# Patient Record
Sex: Female | Born: 1985 | Race: Black or African American | Hispanic: No | Smoking: Current every day smoker
Health system: Southern US, Community
[De-identification: ages and names within clinical notes are randomized; demographics above are authoritative.]

## PROBLEM LIST (undated history)

## (undated) ENCOUNTER — Inpatient Hospital Stay (HOSPITAL_COMMUNITY): Payer: Self-pay

## (undated) DIAGNOSIS — Z8739 Personal history of other diseases of the musculoskeletal system and connective tissue: Secondary | ICD-10-CM

## (undated) DIAGNOSIS — F419 Anxiety disorder, unspecified: Secondary | ICD-10-CM

## (undated) DIAGNOSIS — N390 Urinary tract infection, site not specified: Secondary | ICD-10-CM

## (undated) DIAGNOSIS — R87629 Unspecified abnormal cytological findings in specimens from vagina: Secondary | ICD-10-CM

## (undated) DIAGNOSIS — Z789 Other specified health status: Secondary | ICD-10-CM

## (undated) HISTORY — DX: Anxiety disorder, unspecified: F41.9

## (undated) HISTORY — DX: Personal history of other diseases of the musculoskeletal system and connective tissue: Z87.39

## (undated) HISTORY — PX: COLPOSCOPY: SHX161

---
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
## 2006-04-19 ENCOUNTER — Inpatient Hospital Stay (HOSPITAL_COMMUNITY): Admission: AD | Admit: 2006-04-19 | Discharge: 2006-04-19 | Payer: Self-pay | Admitting: Obstetrics and Gynecology

## 2006-06-17 ENCOUNTER — Other Ambulatory Visit: Admission: RE | Admit: 2006-06-17 | Discharge: 2006-06-17 | Payer: Self-pay | Admitting: Obstetrics and Gynecology

## 2006-08-01 ENCOUNTER — Inpatient Hospital Stay (HOSPITAL_COMMUNITY): Admission: AD | Admit: 2006-08-01 | Discharge: 2006-08-01 | Payer: Self-pay | Admitting: Otolaryngology

## 2006-10-05 ENCOUNTER — Inpatient Hospital Stay (HOSPITAL_COMMUNITY): Admission: AD | Admit: 2006-10-05 | Discharge: 2006-10-08 | Payer: Self-pay | Admitting: Obstetrics and Gynecology

## 2009-01-07 ENCOUNTER — Emergency Department (HOSPITAL_COMMUNITY): Admission: EM | Admit: 2009-01-07 | Discharge: 2009-01-08 | Payer: Self-pay | Admitting: Emergency Medicine

## 2009-09-06 ENCOUNTER — Inpatient Hospital Stay (HOSPITAL_COMMUNITY): Admission: AD | Admit: 2009-09-06 | Discharge: 2009-09-06 | Payer: Self-pay | Admitting: Obstetrics and Gynecology

## 2009-09-24 ENCOUNTER — Inpatient Hospital Stay (HOSPITAL_COMMUNITY): Admission: AD | Admit: 2009-09-24 | Discharge: 2009-09-24 | Payer: Self-pay | Admitting: Obstetrics and Gynecology

## 2009-09-25 ENCOUNTER — Inpatient Hospital Stay (HOSPITAL_COMMUNITY): Admission: AD | Admit: 2009-09-25 | Discharge: 2009-09-25 | Payer: Self-pay | Admitting: Obstetrics and Gynecology

## 2009-09-25 ENCOUNTER — Inpatient Hospital Stay (HOSPITAL_COMMUNITY): Admission: AD | Admit: 2009-09-25 | Discharge: 2009-09-27 | Payer: Self-pay | Admitting: Obstetrics and Gynecology

## 2010-02-01 IMAGING — CR DG ABDOMEN ACUTE W/ 1V CHEST
3 series · 3 of 3 positions shown · non-contrast
Comparison: None

CLINICAL DATA: Abdominal pain.

ACUTE ABDOMEN SERIES (ABDOMEN 2 VIEW & CHEST 1 VIEW)

[w chest pa]
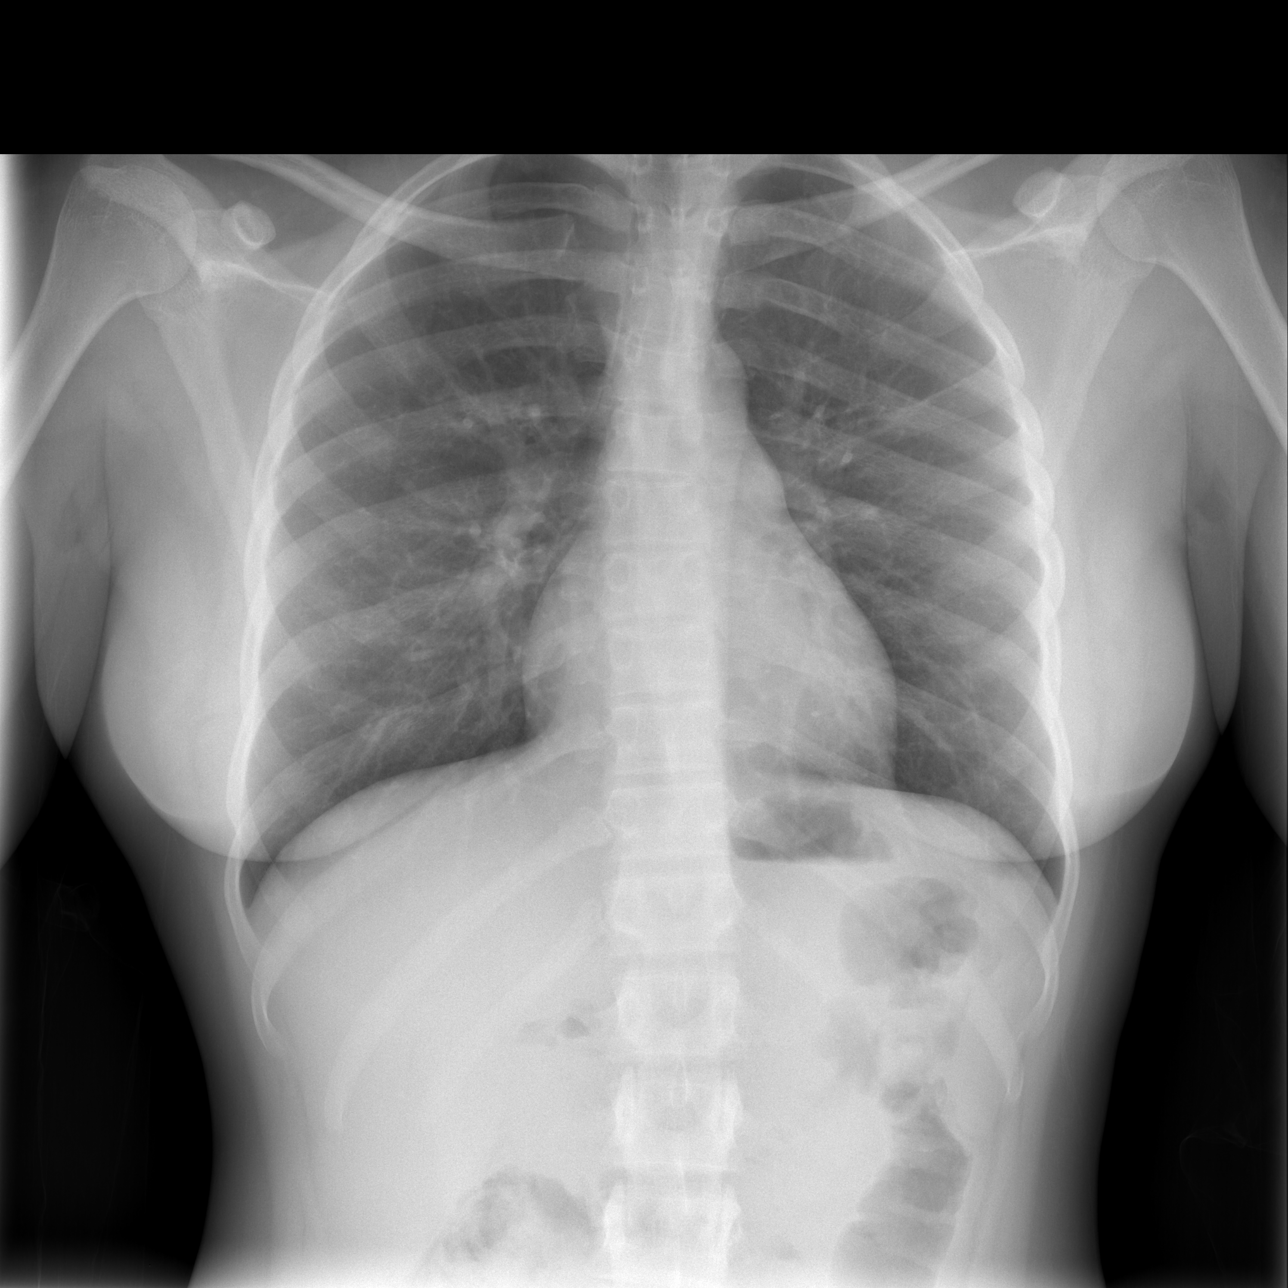

[w abdomen upright *]
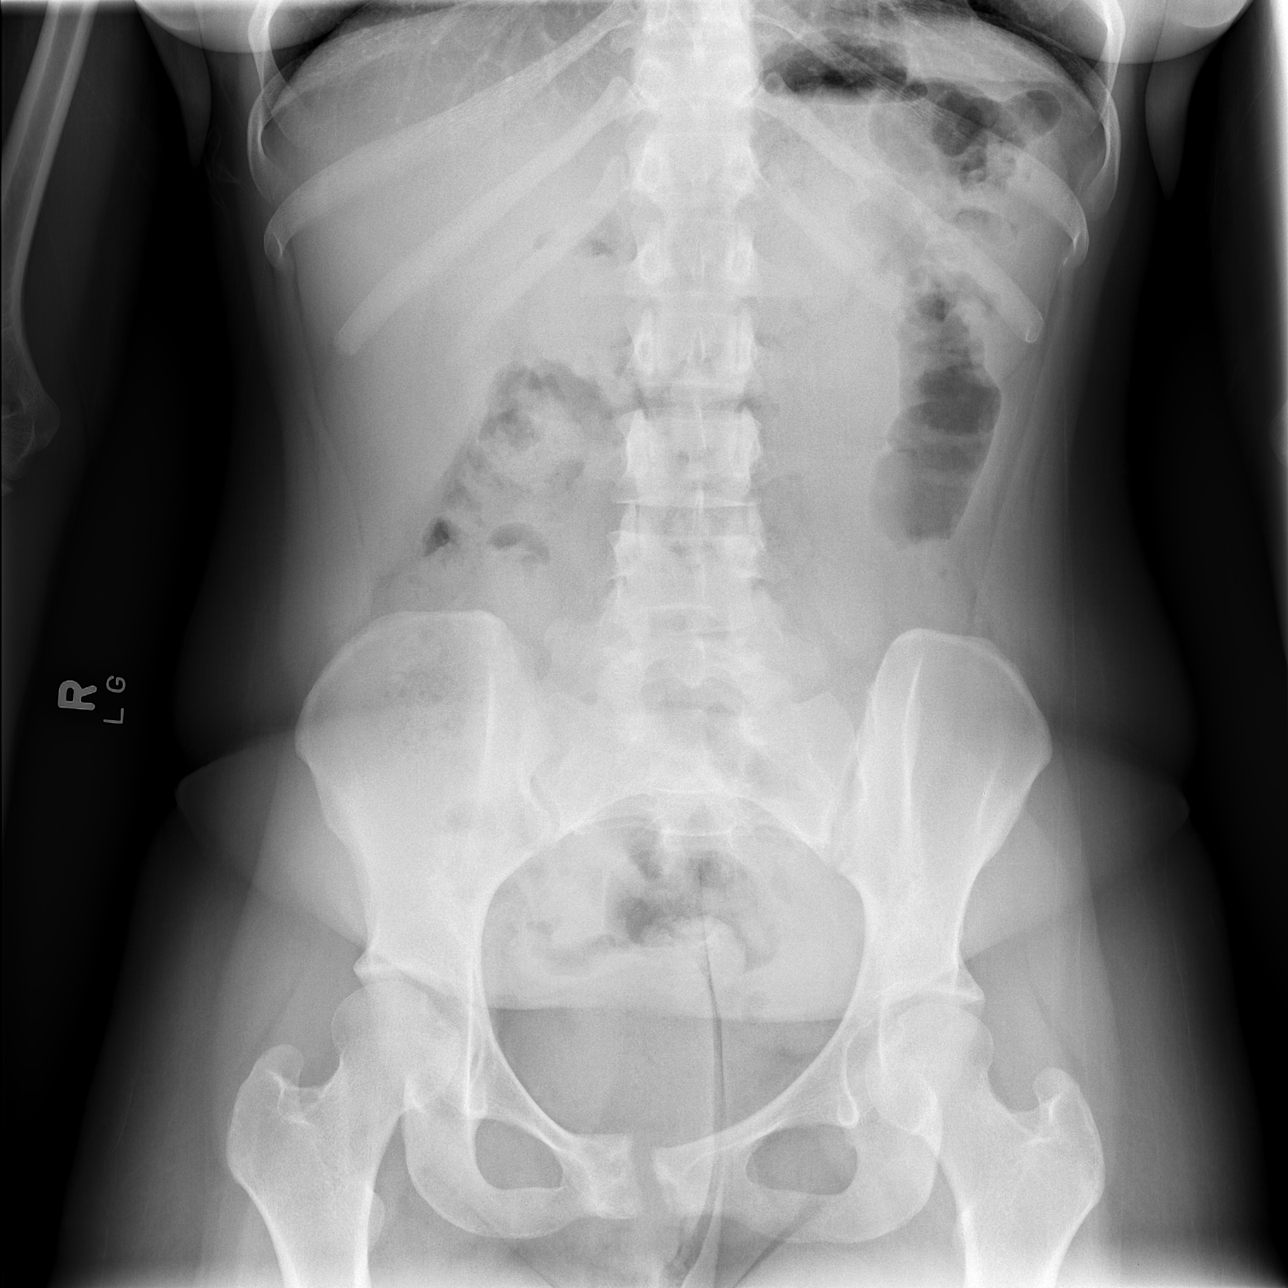

[t abdomen supine]
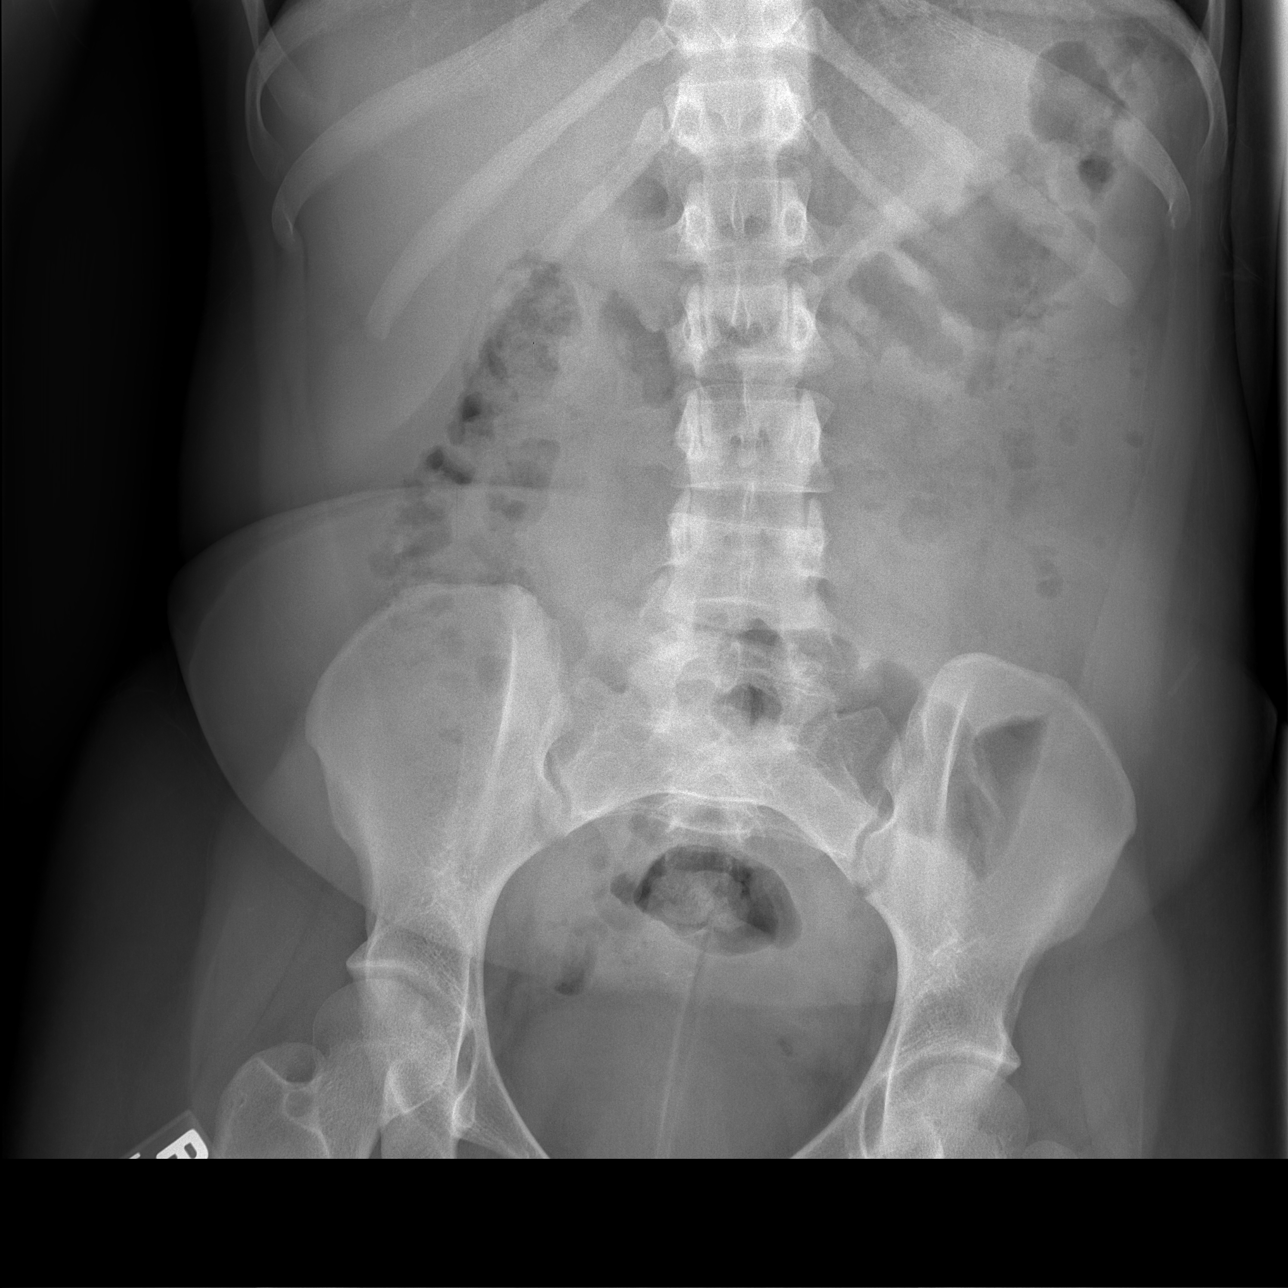

[3 of 3 positions shown; findings below may reference images not displayed]

FINDINGS: The lungs are clear and there is no infiltrate or
effusion.

Nonobstructive bowel gas pattern.  There is no free air.  There is
no renal calculi and there is no bony abnormality.
IMPRESSION: Negative

## 2010-12-24 ENCOUNTER — Emergency Department (HOSPITAL_COMMUNITY)
Admission: EM | Admit: 2010-12-24 | Discharge: 2010-12-24 | Payer: Self-pay | Source: Home / Self Care | Admitting: Emergency Medicine

## 2010-12-28 LAB — URINALYSIS, ROUTINE W REFLEX MICROSCOPIC
Bilirubin Urine: NEGATIVE
Hgb urine dipstick: NEGATIVE
Ketones, ur: NEGATIVE mg/dL
Nitrite: NEGATIVE
Protein, ur: NEGATIVE mg/dL
Specific Gravity, Urine: 1.025 (ref 1.005–1.030)
Urine Glucose, Fasting: NEGATIVE mg/dL
Urobilinogen, UA: 0.2 mg/dL (ref 0.0–1.0)
pH: 6.5 (ref 5.0–8.0)

## 2010-12-28 LAB — POCT PREGNANCY, URINE: Preg Test, Ur: NEGATIVE

## 2011-03-18 LAB — CBC
HCT: 31.4 % — ABNORMAL LOW (ref 36.0–46.0)
HCT: 31.6 % — ABNORMAL LOW (ref 36.0–46.0)
Hemoglobin: 10.3 g/dL — ABNORMAL LOW (ref 12.0–15.0)
Hemoglobin: 10.3 g/dL — ABNORMAL LOW (ref 12.0–15.0)
MCHC: 32.5 g/dL (ref 30.0–36.0)
MCHC: 32.7 g/dL (ref 30.0–36.0)
MCV: 86.1 fL (ref 78.0–100.0)
MCV: 87.2 fL (ref 78.0–100.0)
Platelets: 116 10*3/uL — ABNORMAL LOW (ref 150–400)
Platelets: 92 10*3/uL — ABNORMAL LOW (ref 150–400)
RBC: 3.6 MIL/uL — ABNORMAL LOW (ref 3.87–5.11)
RBC: 3.67 MIL/uL — ABNORMAL LOW (ref 3.87–5.11)
RDW: 14.8 % (ref 11.5–15.5)
RDW: 14.8 % (ref 11.5–15.5)
WBC: 10.3 10*3/uL (ref 4.0–10.5)
WBC: 11.8 10*3/uL — ABNORMAL HIGH (ref 4.0–10.5)

## 2011-03-18 LAB — RH IMMUNE GLOB WKUP(>/=20WKS)(NOT WOMEN'S HOSP)
Antibody Screen: NEGATIVE
Fetal Screen: NEGATIVE

## 2011-03-18 LAB — RPR: RPR Ser Ql: NONREACTIVE

## 2011-03-29 LAB — DIFFERENTIAL
Basophils Absolute: 0.1 10*3/uL (ref 0.0–0.1)
Basophils Relative: 1 % (ref 0–1)
Eosinophils Absolute: 0.3 10*3/uL (ref 0.0–0.7)
Eosinophils Relative: 5 % (ref 0–5)
Lymphocytes Relative: 38 % (ref 12–46)
Lymphs Abs: 2.3 10*3/uL (ref 0.7–4.0)
Monocytes Absolute: 0.5 10*3/uL (ref 0.1–1.0)
Monocytes Relative: 9 % (ref 3–12)
Neutro Abs: 2.8 10*3/uL (ref 1.7–7.7)
Neutrophils Relative %: 47 % (ref 43–77)

## 2011-03-29 LAB — COMPREHENSIVE METABOLIC PANEL
ALT: 11 U/L (ref 0–35)
AST: 17 U/L (ref 0–37)
Albumin: 3.5 g/dL (ref 3.5–5.2)
Alkaline Phosphatase: 38 U/L — ABNORMAL LOW (ref 39–117)
BUN: 11 mg/dL (ref 6–23)
CO2: 26 mEq/L (ref 19–32)
Calcium: 9.2 mg/dL (ref 8.4–10.5)
Chloride: 107 mEq/L (ref 96–112)
Creatinine, Ser: 0.59 mg/dL (ref 0.4–1.2)
GFR calc Af Amer: >60 mL/min (ref >60)
GFR calc non Af Amer: >60 mL/min (ref >60)
Glucose, Bld: 81 mg/dL (ref 70–99)
Potassium: 4.1 mEq/L (ref 3.5–5.1)
Sodium: 137 mEq/L (ref 135–145)
Total Bilirubin: 0.4 mg/dL (ref 0.3–1.2)
Total Protein: 6.2 g/dL (ref 6.0–8.3)

## 2011-03-29 LAB — URINALYSIS, ROUTINE W REFLEX MICROSCOPIC
Bilirubin Urine: NEGATIVE
Glucose, UA: NEGATIVE mg/dL
Hgb urine dipstick: NEGATIVE
Ketones, ur: NEGATIVE mg/dL
Nitrite: NEGATIVE
Protein, ur: NEGATIVE mg/dL
Specific Gravity, Urine: 1.03 (ref 1.005–1.030)
Urobilinogen, UA: 1 mg/dL (ref 0.0–1.0)
pH: 6 (ref 5.0–8.0)

## 2011-03-29 LAB — CBC
HCT: 39.8 % (ref 36.0–46.0)
Hemoglobin: 12.9 g/dL (ref 12.0–15.0)
MCHC: 32.3 g/dL (ref 30.0–36.0)
MCV: 83.4 fL (ref 78.0–100.0)
Platelets: 176 10*3/uL (ref 150–400)
RBC: 4.78 MIL/uL (ref 3.87–5.11)
RDW: 14.5 % (ref 11.5–15.5)
WBC: 6.1 10*3/uL (ref 4.0–10.5)

## 2011-03-29 LAB — WET PREP, GENITAL
Trich, Wet Prep: NONE SEEN
WBC, Wet Prep HPF POC: NONE SEEN
Yeast Wet Prep HPF POC: NONE SEEN

## 2011-03-29 LAB — GC/CHLAMYDIA PROBE AMP, GENITAL
Chlamydia, DNA Probe: NEGATIVE
GC Probe Amp, Genital: NEGATIVE

## 2011-03-29 LAB — POCT PREGNANCY, URINE: Preg Test, Ur: NEGATIVE

## 2011-03-29 LAB — LIPASE, BLOOD: Lipase: 32 U/L (ref 11–59)

## 2011-04-30 NOTE — Discharge Summary (Signed)
NAME:  April Schaefer, April Schaefer            ACCOUNT NO.:  [id number]   MEDICAL RECORD NO.:  [id number]          PATIENT TYPE:  INP   LOCATION:  9103                          FACILITY:  [facility]   PHYSICIAN:  Sherron Monday, MD        DATE OF BIRTH:  1986/06/23   DATE OF ADMISSION:  10/05/2006  DATE OF DISCHARGE:  10/08/2006                                 DISCHARGE SUMMARY   ADMITTING DIAGNOSIS:  Intrauterine pregnancy at term, laboring.   DISCHARGE DIAGNOSES:  1. Intrauterine pregnancy at term, laboring.  2. Delivered via term spontaneous vaginal delivery.   HISTORY OF PRESENT ILLNESS:  A 25 year old G3, P1-0-1-1 at 37-5/7 weeks with  contractions since 3 p.m. that day.  She was at approximately 6, no loss of  fluid, no vaginal bleeding, good fetal movement.   PAST MEDICAL HISTORY:  Noncontributory.   PAST SURGICAL HISTORY:  None.   SIGNIFICANT OB/GYN HISTORY:  G1 was a miscarriage.  G2 term vaginal delivery  of a female infant who is 68 months old currently.  G3 is the present.  She  has no history of any abnormal Pap smears or sexually transmitted disease.   MEDICATIONS:  None.   ALLERGIES:  No known drug allergies.   SOCIAL HISTORY:  Positive for tobacco, roughly 2-3 cigarettes a day.  No  alcohol or drugs.  She is single.   FAMILY HISTORY:  Significant for diabetes in her mother, otherwise negative.   PRENATAL LABS:  She is group B Strep positive.  Glucola 133, RPR  nonreactive.  O negative.  Hemoglobin 11.2, platelets 162,000.  Antibody  screen negative, sickle cell within normal limits. Gonorrhea negative,  Chlamydia negative, rubella immune.  Hepatitis C surface antigen negative.  HIV negative.   On admission she is 6 cm dilated, 90% effaced and 0 station.  Otherwise her  exam was within normal limits.  Fetal heart tones were in the 140s and  reactive with contractions every 1-3 minutes, somewhat irregular, She was  admitted to the hospital and given penicillin for group B strep  prophylaxis.  Her pregnancy is dated by a first trimester ultrasound.  Her intrapartum  course was relatively uncomplicated.  She had artificial rupture of  membranes at 7 o'clock of clear fluid.  She progressed to Complete/ Complete  and + 2 and pushed for delivery of a viable female infant at 29 with Apgars  of 9 and 9 at one and five minutes respectively; and a weight of 5 pounds 13  ounces.  A second degree perineal laceration was repaired with 3-0 Vicryl.  Her postpartum course was relatively uncomplicated.  Her hemoglobin had  decreased from 10.6 to 9.2; however, she tolerated that well.  She is  afebrile with vital signs stable.  She will breast feed the baby.  She plans  on using the Nuvaring for contraception.  She is rubella immune and O  negative.  She will be discharged home with prescription for prenatal  vitamins as well as  ibuprofen.  She desires Nuvaring for contraception and will start that her  postpartum checkup.  She was  given appropriate numbers to call if there are  any questions or problems.  She voiced her understanding to all this and was  discharged to home.      Sherron Monday, MD  Electronically Signed     JB/MEDQ  D:  10/07/2006  T:  10/08/2006  Job:  [id number]

## 2011-10-10 ENCOUNTER — Emergency Department (HOSPITAL_COMMUNITY)
Admission: EM | Admit: 2011-10-10 | Discharge: 2011-10-10 | Disposition: A | Discharge status: Home / Self Care | Payer: Self-pay | Attending: Emergency Medicine | Admitting: Emergency Medicine

## 2011-10-10 DIAGNOSIS — R079 Chest pain, unspecified: Secondary | ICD-10-CM | POA: Insufficient documentation

## 2011-10-10 DIAGNOSIS — F410 Panic disorder [episodic paroxysmal anxiety] without agoraphobia: Secondary | ICD-10-CM | POA: Insufficient documentation

## 2011-10-10 DIAGNOSIS — M940 Chondrocostal junction syndrome [Tietze]: Secondary | ICD-10-CM | POA: Insufficient documentation

## 2011-12-19 ENCOUNTER — Emergency Department (HOSPITAL_COMMUNITY): Payer: Self-pay

## 2011-12-19 ENCOUNTER — Emergency Department (HOSPITAL_COMMUNITY)
Admission: EM | Admit: 2011-12-19 | Discharge: 2011-12-20 | Disposition: A | Discharge status: Home / Self Care | Payer: Self-pay | Attending: Emergency Medicine | Admitting: Emergency Medicine

## 2011-12-19 DIAGNOSIS — R059 Cough, unspecified: Secondary | ICD-10-CM | POA: Insufficient documentation

## 2011-12-19 DIAGNOSIS — R079 Chest pain, unspecified: Secondary | ICD-10-CM | POA: Insufficient documentation

## 2011-12-19 DIAGNOSIS — R0602 Shortness of breath: Secondary | ICD-10-CM | POA: Insufficient documentation

## 2011-12-19 DIAGNOSIS — F17200 Nicotine dependence, unspecified, uncomplicated: Secondary | ICD-10-CM | POA: Insufficient documentation

## 2011-12-19 DIAGNOSIS — R05 Cough: Secondary | ICD-10-CM | POA: Insufficient documentation

## 2011-12-19 DIAGNOSIS — R5381 Other malaise: Secondary | ICD-10-CM | POA: Insufficient documentation

## 2011-12-19 DIAGNOSIS — J3489 Other specified disorders of nose and nasal sinuses: Secondary | ICD-10-CM | POA: Insufficient documentation

## 2011-12-19 DIAGNOSIS — J069 Acute upper respiratory infection, unspecified: Secondary | ICD-10-CM | POA: Insufficient documentation

## 2011-12-19 DIAGNOSIS — R6883 Chills (without fever): Secondary | ICD-10-CM | POA: Insufficient documentation

## 2011-12-19 DIAGNOSIS — H9209 Otalgia, unspecified ear: Secondary | ICD-10-CM | POA: Insufficient documentation

## 2011-12-19 DIAGNOSIS — R070 Pain in throat: Secondary | ICD-10-CM | POA: Insufficient documentation

## 2011-12-19 MED ORDER — CETIRIZINE-PSEUDOEPHEDRINE ER 5-120 MG PO TB12: 1.0000 | ORAL_TABLET | Freq: Every day | ORAL | Status: AC

## 2011-12-19 MED ORDER — HYDROCODONE-HOMATROPINE 5-1.5 MG/5ML PO SYRP: 5.0000 mL | ORAL_SOLUTION | Freq: Four times a day (QID) | ORAL | Status: AC | PRN

## 2011-12-19 MED ORDER — IBUPROFEN 800 MG PO TABS
800.0000 mg | ORAL_TABLET | Freq: Once | ORAL | Status: AC
  Administered 2011-12-19: 800 mg via ORAL
  Filled 2011-12-19: qty 1

## 2011-12-19 MED ORDER — HYDROCOD POLST-CHLORPHEN POLST 10-8 MG/5ML PO LQCR
5.0000 mL | Freq: Once | ORAL | Status: AC
  Administered 2011-12-19: 5 mL via ORAL
  Filled 2011-12-19: qty 5

## 2011-12-19 NOTE — ED Notes (Signed)
Pt reports URI and sore throat 2 days ago- Increased coughing at night.  Upper chest pain increased pain with coughinh

## 2011-12-19 NOTE — ED Provider Notes (Signed)
History     CSN: [id number]  Arrival date & time 12/19/11  2003   First MD Initiated Contact with Patient 12/19/11 2214      Chief Complaint  Patient presents with  . URI  . Sore Throat  . Chest Pain    (Consider location/radiation/quality/duration/timing/severity/associated sxs/prior treatment) Patient is a 26 y.o. female presenting with URI. The history is provided by the patient.  URI The primary symptoms include fatigue, ear pain, sore throat and cough. Primary symptoms do not include fever. The current episode started 2 days ago. This is a new problem. The problem has not changed since onset. Symptoms associated with the illness include chills, plugged ear sensation, sinus pressure and congestion.  Pt states URI symptoms for two days. Today coughing, chest pain with cough, radiating to the back. Denies fever, chills, nausea, vomiting, abdominal pain, malaise. NO neck pain or stiffness. Took, nyquil with no relief.  History reviewed. No pertinent past medical history.  History reviewed. No pertinent past surgical history.  History reviewed. No pertinent family history.  History  Substance Use Topics  . Smoking status: Current Everyday Smoker  . Smokeless tobacco: Not on file  . Alcohol Use: Yes    OB History    Grav Para Term Preterm Abortions TAB SAB Ect Mult Living                  Review of Systems  Constitutional: Positive for chills and fatigue. Negative for fever.  HENT: Positive for ear pain, congestion, sore throat and sinus pressure.   Eyes: Negative.   Respiratory: Positive for cough and shortness of breath.   Cardiovascular: Positive for chest pain. Negative for leg swelling.  Gastrointestinal: Negative.   Genitourinary: Negative.   Musculoskeletal: Negative.   Neurological: Negative.   Psychiatric/Behavioral: Negative.     Allergies  Nickel  Home Medications  No current outpatient prescriptions on file.  BP 158/78  Pulse 77  Temp(Src) 98.8  F (37.1 C) (Oral)  Resp 18  Wt 150 lb (68.04 kg)  SpO2 100%  LMP 11/18/2011  Physical Exam  Nursing note and vitals reviewed. Constitutional: She is oriented to person, place, and time. She appears well-developed and well-nourished. No distress.  HENT:  Head: Normocephalic and atraumatic.  Right Ear: Tympanic membrane, external ear and ear canal normal.  Left Ear: Tympanic membrane, external ear and ear canal normal.  Nose: Rhinorrhea present.  Mouth/Throat: Uvula is midline, oropharynx is clear and moist and mucous membranes are normal.  Eyes: Conjunctivae are normal.  Neck: Normal range of motion. Neck supple.  Cardiovascular: Normal rate, regular rhythm and normal heart sounds.   Pulmonary/Chest: Effort normal and breath sounds normal. No respiratory distress. She has no wheezes.  Abdominal: Soft. Bowel sounds are normal. There is no tenderness.  Musculoskeletal: Normal range of motion.  Lymphadenopathy:    She has no cervical adenopathy.  Neurological: She is alert and oriented to person, place, and time.  Skin: Skin is warm and dry. No rash noted.  Psychiatric: She has a normal mood and affect.    ED Course  Procedures (including critical care time)  Labs Reviewed - No data to display Dg Chest 2 View  12/19/2011  *RADIOLOGY REPORT*  Clinical Data: Chest pain.  Cough.  CHEST - 2 VIEW  Comparison: 12/24/2010.  Findings:  Cardiopericardial silhouette within normal limits. Mediastinal contours normal. Trachea midline.  No airspace disease or effusion.  IMPRESSION: Negative two-view chest.  Original Report Authenticated By: Andreas Newport,  M.D.   URI symptoms. Normal VS. Negative chest. Suspect a viral URI. Will d/c home with follow up.  No diagnosis found.    MDM          Lottie Mussel, PA 12/19/11 [phone]

## 2011-12-19 NOTE — ED Notes (Signed)
Pt reports body aches chest, abdomen and back. Pt reports her neck feels swollen.  Pt reports two days ago symptoms started with sore throat. Pt denies N/V/D.  Pt reports felt warm to touch, but unsure of fever. Pt reports no first degree friend or relative sick like this. Pt denies taking medication for her pain.  Pt reports taking nyquil on first night of symptoms without relief. Pt also reports hoarseness. Cough followed sore throat. Non-productive, dry.

## 2011-12-20 NOTE — ED Provider Notes (Signed)
Medical screening examination/treatment/procedure(s) were performed by non-physician practitioner and as supervising physician I was immediately available for consultation/collaboration.  Raeford Razor, MD 12/20/11 [phone]

## 2013-02-23 ENCOUNTER — Encounter (HOSPITAL_COMMUNITY): Payer: Self-pay | Admitting: Emergency Medicine

## 2013-02-23 ENCOUNTER — Emergency Department (HOSPITAL_COMMUNITY)
Admission: EM | Admit: 2013-02-23 | Discharge: 2013-02-23 | Disposition: A | Discharge status: Home / Self Care | Payer: Self-pay | Attending: Emergency Medicine | Admitting: Emergency Medicine

## 2013-02-23 DIAGNOSIS — T4995XA Adverse effect of unspecified topical agent, initial encounter: Secondary | ICD-10-CM | POA: Insufficient documentation

## 2013-02-23 DIAGNOSIS — L299 Pruritus, unspecified: Secondary | ICD-10-CM | POA: Insufficient documentation

## 2013-02-23 DIAGNOSIS — F17200 Nicotine dependence, unspecified, uncomplicated: Secondary | ICD-10-CM | POA: Insufficient documentation

## 2013-02-23 DIAGNOSIS — T7840XA Allergy, unspecified, initial encounter: Secondary | ICD-10-CM | POA: Insufficient documentation

## 2013-02-23 MED ORDER — PREDNISONE 10 MG PO TABS: 60.0000 mg | ORAL_TABLET | Freq: Every day | ORAL | Status: AC

## 2013-02-23 MED ORDER — DIPHENHYDRAMINE HCL 25 MG PO TABS: 25.0000 mg | ORAL_TABLET | Freq: Four times a day (QID) | ORAL | Status: DC

## 2013-02-23 MED ORDER — FAMOTIDINE 20 MG PO TABS
20.0000 mg | ORAL_TABLET | Freq: Once | ORAL | Status: AC
  Administered 2013-02-23: 20 mg via ORAL
  Filled 2013-02-23: qty 1

## 2013-02-23 MED ORDER — PREDNISONE 20 MG PO TABS
60.0000 mg | ORAL_TABLET | Freq: Once | ORAL | Status: AC
  Administered 2013-02-23: 60 mg via ORAL
  Filled 2013-02-23: qty 3

## 2013-02-23 MED ORDER — RANITIDINE HCL 150 MG PO CAPS: 150.0000 mg | ORAL_CAPSULE | Freq: Two times a day (BID) | ORAL | Status: DC

## 2013-02-23 NOTE — ED Notes (Signed)
Patient complaining of an allergic reaction shortly after eating dinner this evening around 1800 (ate steak, cheese, peppers, onions, and broccoli).  Took Benadryl (two capsules) around 1900; had provided little to no relief.  Patient reports that she had a similar allergic reaction three years ago, took Benadryl, and symptoms subsided.  Patient alert and oriented x4; PERRL present.  Upon arrival to room, patient changed into gown and IV started.  Airway clear at this time; breathing regular and unlabored.  Redness noted to face.  Eyes slightly swollen.  Patient complaining of itchiness in back and breast areas.  Will continue to monitor.

## 2013-02-23 NOTE — ED Notes (Signed)
Patient given copy of discharge paperwork; went over discharge instructions with patient.  Patient instructed to take prescriptions as directed, to follow up with PCP within one week, and to return to the ED for new, worsening, or concerning symptoms.

## 2013-02-23 NOTE — ED Notes (Signed)
C/o allergic reaction that started around 6:30pm.  C/o itching all over, sob, and feeling like throat is closing.  Pt states she ate hamburger steak with mushrooms and broccoli around 4:30pm.  Denies any known allergies.  Denies new soaps, laundry powder, etc.  Took 2 Benadryl at 7pm.

## 2013-02-23 NOTE — ED Provider Notes (Signed)
History     CSN: [id number]  Arrival date & time 02/23/13  1947   First MD Initiated Contact with Patient 02/23/13 2111      Chief Complaint  Patient presents with  . Allergic Reaction    (Consider location/radiation/quality/duration/timing/severity/associated sxs/prior treatment) Patient is a 27 y.o. female presenting with allergic reaction.  Allergic Reaction The primary symptoms are  rash, angioedema and urticaria. The primary symptoms do not include wheezing, shortness of breath, cough, abdominal pain, nausea, vomiting, diarrhea or dizziness. The current episode started 3 to 5 hours ago. The problem has not changed since onset.This is a new problem.  The rash appears on the chest. The rash is associated with itching.  It is located on the lips and eyelids. The angioedema is not associated with shortness of breath or stridor.   Associated with: unknown. Significant symptoms also include itching.    History reviewed. No pertinent past medical history.  History reviewed. No pertinent past surgical history.  No family history on file.  History  Substance Use Topics  . Smoking status: Current Every Day Smoker  . Smokeless tobacco: Not on file  . Alcohol Use: Yes    OB History   Grav Para Term Preterm Abortions TAB SAB Ect Mult Living                  Review of Systems  Constitutional: Negative for fever.  HENT: Negative for congestion.   Respiratory: Negative for cough, shortness of breath, wheezing and stridor.   Cardiovascular: Negative for chest pain.  Gastrointestinal: Negative for nausea, vomiting, abdominal pain and diarrhea.  Genitourinary: Negative for difficulty urinating.  Skin: Positive for itching and rash.  Neurological: Negative for dizziness.  All other systems reviewed and are negative.    Allergies  Nickel  Home Medications  No current outpatient prescriptions on file.  BP 131/84  Pulse 72  Temp(Src) 98.5 F (36.9 C) (Oral)  Resp 18   SpO2 100%  LMP 02/09/2013  Physical Exam  Nursing note and vitals reviewed. Constitutional: She is oriented to person, place, and time. She appears well-developed and well-nourished. No distress.  HENT:  Head: Normocephalic and atraumatic.  Mouth/Throat: Oropharynx is clear and moist.  Mild flushing and redness of nasolabial folds. Mild swelling of left eyelid.   No oropharyngeal edema or erythema.   No stridor.   Eyes: Conjunctivae are normal. Pupils are equal, round, and reactive to light. No scleral icterus.  Neck: Neck supple.  Cardiovascular: Normal rate, regular rhythm, normal heart sounds and intact distal pulses.   No murmur heard. Pulmonary/Chest: Effort normal and breath sounds normal. No stridor. No respiratory distress. She has no wheezes. She has no rales.  Abdominal: Soft. Bowel sounds are normal. She exhibits no distension. There is no tenderness. There is no rigidity, no rebound and no guarding.  Musculoskeletal: Normal range of motion.  Neurological: She is alert and oriented to person, place, and time.  Skin: Skin is warm and dry. No rash noted.  No urticaria  Psychiatric: She has a normal mood and affect. Her behavior is normal.    ED Course  Procedures (including critical care time)  Labs Reviewed - No data to display No results found.   1. Allergic reaction, initial encounter       MDM  27 yo female with apparent allergic reaction.  Well appearing, no distress.  Denies symptoms consistent with anaphylaxis.  Does not need epi.  She denies throat swelling to me and has  none on exam.  She does not want to take benadryl in ED because she did not want to be drowsy for her drive home.  She did improve somewhat from her prednisone and H2 antagonist (and from the benadryl she took at home.)  DC'd in good condition with good instructions and advised follow up.          Rennis Petty, MD 02/24/13 0010

## 2013-02-24 NOTE — ED Provider Notes (Signed)
I saw and evaluated the patient, reviewed the resident's note and I agree with the findings and plan.  Toy Baker, MD 02/24/13 [phone]

## 2014-03-12 LAB — OB RESULTS CONSOLE GC/CHLAMYDIA
CHLAMYDIA, DNA PROBE: NEGATIVE
Chlamydia: NEGATIVE
Gonorrhea: NEGATIVE
Gonorrhea: NEGATIVE

## 2014-03-12 LAB — OB RESULTS CONSOLE RUBELLA ANTIBODY, IGM
RUBELLA: IMMUNE
Rubella: IMMUNE

## 2014-03-12 LAB — OB RESULTS CONSOLE RPR
RPR: NONREACTIVE
RPR: NONREACTIVE

## 2014-03-12 LAB — OB RESULTS CONSOLE ABO/RH
RH TYPE: NEGATIVE
RH Type: NEGATIVE

## 2014-03-12 LAB — OB RESULTS CONSOLE HEPATITIS B SURFACE ANTIGEN
Hepatitis B Surface Ag: NEGATIVE
Hepatitis B Surface Ag: NEGATIVE

## 2014-03-12 LAB — OB RESULTS CONSOLE ANTIBODY SCREEN
Antibody Screen: NEGATIVE
Antibody Screen: NEGATIVE

## 2014-03-12 LAB — OB RESULTS CONSOLE HIV ANTIBODY (ROUTINE TESTING)
HIV: NONREACTIVE
HIV: NONREACTIVE

## 2014-07-04 LAB — OB RESULTS CONSOLE GBS: GBS: NEGATIVE

## 2014-07-13 ENCOUNTER — Inpatient Hospital Stay (HOSPITAL_COMMUNITY)
Admission: AD | Admit: 2014-07-13 | Discharge: 2014-07-13 | Disposition: A | Discharge status: Home / Self Care | Payer: Medicaid Other | Source: Ambulatory Visit | Attending: Obstetrics and Gynecology | Admitting: Obstetrics and Gynecology

## 2014-07-13 ENCOUNTER — Encounter (HOSPITAL_COMMUNITY): Payer: Self-pay | Admitting: *Deleted

## 2014-07-13 DIAGNOSIS — O4700 False labor before 37 completed weeks of gestation, unspecified trimester: Secondary | ICD-10-CM | POA: Diagnosis not present

## 2014-07-13 HISTORY — DX: Other specified health status: Z78.9

## 2014-07-13 HISTORY — DX: Unspecified abnormal cytological findings in specimens from vagina: R87.629

## 2014-07-13 NOTE — MAU Note (Signed)
Pt reports contractions on off all week. Since 1pm  7-10 min apart. reports some increased clear discharge no bleeding and good fetal movement

## 2014-07-13 NOTE — MAU Provider Note (Signed)
NST in MAU for labor check - 140 R

## 2014-07-30 ENCOUNTER — Inpatient Hospital Stay (HOSPITAL_COMMUNITY)
Admission: AD | Admit: 2014-07-30 | Discharge: 2014-07-30 | Disposition: A | Discharge status: Home / Self Care | Payer: Medicaid Other | Source: Ambulatory Visit | Attending: Obstetrics and Gynecology | Admitting: Obstetrics and Gynecology

## 2014-07-30 ENCOUNTER — Encounter (HOSPITAL_COMMUNITY): Payer: Self-pay | Admitting: *Deleted

## 2014-07-30 DIAGNOSIS — O479 False labor, unspecified: Secondary | ICD-10-CM | POA: Insufficient documentation

## 2014-07-30 MED ORDER — ZOLPIDEM TARTRATE 5 MG PO TABS: 5.0000 mg | ORAL_TABLET | Freq: Once | ORAL | Status: DC

## 2014-07-30 NOTE — Discharge Instructions (Signed)
Third Trimester of Pregnancy °The third trimester is from week 29 through week 42, months 7 through 9. The third trimester is a time when the fetus is growing rapidly. At the end of the ninth month, the fetus is about 20 inches in length and weighs 6-10 pounds.  °BODY CHANGES °Your body goes through many changes during pregnancy. The changes vary from woman to woman.  °· Your weight will continue to increase. You can expect to gain 25-35 pounds (11-16 kg) by the end of the pregnancy. °· You may begin to get stretch marks on your hips, abdomen, and breasts. °· You may urinate more often because the fetus is moving lower into your pelvis and pressing on your bladder. °· You may develop or continue to have heartburn as a result of your pregnancy. °· You may develop constipation because certain hormones are causing the muscles that push waste through your intestines to slow down. °· You may develop hemorrhoids or swollen, bulging veins (varicose veins). °· You may have pelvic pain because of the weight gain and pregnancy hormones relaxing your joints between the bones in your pelvis. Backaches may result from overexertion of the muscles supporting your posture. °· You may have changes in your hair. These can include thickening of your hair, rapid growth, and changes in texture. Some women also have hair loss during or after pregnancy, or hair that feels dry or thin. Your hair will most likely return to normal after your baby is born. °· Your breasts will continue to grow and be tender. A yellow discharge may leak from your breasts called colostrum. °· Your belly button may stick out. °· You may feel short of breath because of your expanding uterus. °· You may notice the fetus "dropping," or moving lower in your abdomen. °· You may have a bloody mucus discharge. This usually occurs a few days to a week before labor begins. °· Your cervix becomes thin and soft (effaced) near your due date. °WHAT TO EXPECT AT YOUR PRENATAL  EXAMS  °You will have prenatal exams every 2 weeks until week 36. Then, you will have weekly prenatal exams. During a routine prenatal visit: °· You will be weighed to make sure you and the fetus are growing normally. °· Your blood pressure is taken. °· Your abdomen will be measured to track your baby's growth. °· The fetal heartbeat will be listened to. °· Any test results from the previous visit will be discussed. °· You may have a cervical check near your due date to see if you have effaced. °At around 36 weeks, your caregiver will check your cervix. At the same time, your caregiver will also perform a test on the secretions of the vaginal tissue. This test is to determine if a type of bacteria, Group B streptococcus, is present. Your caregiver will explain this further. °Your caregiver may ask you: °· What your birth plan is. °· How you are feeling. °· If you are feeling the baby move. °· If you have had any abnormal symptoms, such as leaking fluid, bleeding, severe headaches, or abdominal cramping. °· If you have any questions. °Other tests or screenings that may be performed during your third trimester include: °· Blood tests that check for low iron levels (anemia). °· Fetal testing to check the health, activity level, and growth of the fetus. Testing is done if you have certain medical conditions or if there are problems during the pregnancy. °FALSE LABOR °You may feel small, irregular contractions that   eventually go away. These are called Braxton Hicks contractions, or false labor. Contractions may last for hours, days, or even weeks before true labor sets in. If contractions come at regular intervals, intensify, or become painful, it is best to be seen by your caregiver.  °SIGNS OF LABOR  °· Menstrual-like cramps. °· Contractions that are 5 minutes apart or less. °· Contractions that start on the top of the uterus and spread down to the lower abdomen and back. °· A sense of increased pelvic pressure or back  pain. °· A watery or bloody mucus discharge that comes from the vagina. °If you have any of these signs before the 37th week of pregnancy, call your caregiver right away. You need to go to the hospital to get checked immediately. °HOME CARE INSTRUCTIONS  °· Avoid all smoking, herbs, alcohol, and unprescribed drugs. These chemicals affect the formation and growth of the baby. °· Follow your caregiver's instructions regarding medicine use. There are medicines that are either safe or unsafe to take during pregnancy. °· Exercise only as directed by your caregiver. Experiencing uterine cramps is a good sign to stop exercising. °· Continue to eat regular, healthy meals. °· Wear a good support bra for breast tenderness. °· Do not use hot tubs, steam rooms, or saunas. °· Wear your seat belt at all times when driving. °· Avoid raw meat, uncooked cheese, cat litter boxes, and soil used by cats. These carry germs that can cause birth defects in the baby. °· Take your prenatal vitamins. °· Try taking a stool softener (if your caregiver approves) if you develop constipation. Eat more high-fiber foods, such as fresh vegetables or fruit and whole grains. Drink plenty of fluids to keep your urine clear or pale yellow. °· Take warm sitz baths to soothe any pain or discomfort caused by hemorrhoids. Use hemorrhoid cream if your caregiver approves. °· If you develop varicose veins, wear support hose. Elevate your feet for 15 minutes, 3-4 times a day. Limit salt in your diet. °· Avoid heavy lifting, wear low heal shoes, and practice good posture. °· Rest a lot with your legs elevated if you have leg cramps or low back pain. °· Visit your dentist if you have not gone during your pregnancy. Use a soft toothbrush to brush your teeth and be gentle when you floss. °· A sexual relationship may be continued unless your caregiver directs you otherwise. °· Do not travel far distances unless it is absolutely necessary and only with the approval  of your caregiver. °· Take prenatal classes to understand, practice, and ask questions about the labor and delivery. °· Make a trial run to the hospital. °· Pack your hospital bag. °· Prepare the baby's nursery. °· Continue to go to all your prenatal visits as directed by your caregiver. °SEEK MEDICAL CARE IF: °· You are unsure if you are in labor or if your water has broken. °· You have dizziness. °· You have mild pelvic cramps, pelvic pressure, or nagging pain in your abdominal area. °· You have persistent nausea, vomiting, or diarrhea. °· You have a bad smelling vaginal discharge. °· You have pain with urination. °SEEK IMMEDIATE MEDICAL CARE IF:  °· You have a fever. °· You are leaking fluid from your vagina. °· You have spotting or bleeding from your vagina. °· You have severe abdominal cramping or pain. °· You have rapid weight loss or gain. °· You have shortness of breath with chest pain. °· You notice sudden or extreme swelling   of your face, hands, ankles, feet, or legs. °· You have not felt your baby move in over an hour. °· You have severe headaches that do not go away with medicine. °· You have vision changes. °Document Released: 11/23/2001 Document Revised: 12/04/2013 Document Reviewed: 01/30/2013 °[employer]® Patient Information ©2015 [employer]. This information is not intended to replace advice given to you by your health care provider. Make sure you discuss any questions you have with your health care provider. °Fetal Movement Counts °Patient Name: __________________________________________________ Patient Due Date: ____________________ °Performing a fetal movement count is highly recommended in high-risk pregnancies, but it is good for every pregnant woman to do. Your health care provider may ask you to start counting fetal movements at 28 weeks of the pregnancy. Fetal movements often increase: °· After eating a full meal. °· After physical activity. °· After eating or drinking something sweet or  cold. °· At rest. °Pay attention to when you feel the baby is most active. This will help you notice a pattern of your baby's sleep and wake cycles and what factors contribute to an increase in fetal movement. It is important to perform a fetal movement count at the same time each day when your baby is normally most active.  °HOW TO COUNT FETAL MOVEMENTS °1. Find a quiet and comfortable area to sit or lie down on your left side. Lying on your left side provides the best blood and oxygen circulation to your baby. °2. Write down the day and time on a sheet of paper or in a journal. °3. Start counting kicks, flutters, swishes, rolls, or jabs in a 2-hour period. You should feel at least 10 movements within 2 hours. °4. If you do not feel 10 movements in 2 hours, wait 2-3 hours and count again. Look for a change in the pattern or not enough counts in 2 hours. °SEEK MEDICAL CARE IF: °· You feel less than 10 counts in 2 hours, tried twice. °· There is no movement in over an hour. °· The pattern is changing or taking longer each day to reach 10 counts in 2 hours. °· You feel the baby is not moving as he or she usually does. °Date: ____________ Movements: ____________ Start time: ____________ Finish time: ____________  °Date: ____________ Movements: ____________ Start time: ____________ Finish time: ____________ °Date: ____________ Movements: ____________ Start time: ____________ Finish time: ____________ °Date: ____________ Movements: ____________ Start time: ____________ Finish time: ____________ °Date: ____________ Movements: ____________ Start time: ____________ Finish time: ____________ °Date: ____________ Movements: ____________ Start time: ____________ Finish time: ____________ °Date: ____________ Movements: ____________ Start time: ____________ Finish time: ____________ °Date: ____________ Movements: ____________ Start time: ____________ Finish time: ____________  °Date: ____________ Movements: ____________ Start  time: ____________ Finish time: ____________ °Date: ____________ Movements: ____________ Start time: ____________ Finish time: ____________ °Date: ____________ Movements: ____________ Start time: ____________ Finish time: ____________ °Date: ____________ Movements: ____________ Start time: ____________ Finish time: ____________ °Date: ____________ Movements: ____________ Start time: ____________ Finish time: ____________ °Date: ____________ Movements: ____________ Start time: ____________ Finish time: ____________ °Date: ____________ Movements: ____________ Start time: ____________ Finish time: ____________  °Date: ____________ Movements: ____________ Start time: ____________ Finish time: ____________ °Date: ____________ Movements: ____________ Start time: ____________ Finish time: ____________ °Date: ____________ Movements: ____________ Start time: ____________ Finish time: ____________ °Date: ____________ Movements: ____________ Start time: ____________ Finish time: ____________ °Date: ____________ Movements: ____________ Start time: ____________ Finish time: ____________ °Date: ____________ Movements: ____________ Start time: ____________ Finish time: ____________ °Date: ____________ Movements: ____________ Start time: ____________ Finish time:   ____________  °Date: ____________ Movements: ____________ Start time: ____________ Finish time: ____________ °Date: ____________ Movements: ____________ Start time: ____________ Finish time: ____________ °Date: ____________ Movements: ____________ Start time: ____________ Finish time: ____________ °Date: ____________ Movements: ____________ Start time: ____________ Finish time: ____________ °Date: ____________ Movements: ____________ Start time: ____________ Finish time: ____________ °Date: ____________ Movements: ____________ Start time: ____________ Finish time: ____________ °Date: ____________ Movements: ____________ Start time: ____________ Finish time: ____________    °Date: ____________ Movements: ____________ Start time: ____________ Finish time: ____________ °Date: ____________ Movements: ____________ Start time: ____________ Finish time: ____________ °Date: ____________ Movements: ____________ Start time: ____________ Finish time: ____________ °Date: ____________ Movements: ____________ Start time: ____________ Finish time: ____________ °Date: ____________ Movements: ____________ Start time: ____________ Finish time: ____________ °Date: ____________ Movements: ____________ Start time: ____________ Finish time: ____________ °Date: ____________ Movements: ____________ Start time: ____________ Finish time: ____________  °Date: ____________ Movements: ____________ Start time: ____________ Finish time: ____________ °Date: ____________ Movements: ____________ Start time: ____________ Finish time: ____________ °Date: ____________ Movements: ____________ Start time: ____________ Finish time: ____________ °Date: ____________ Movements: ____________ Start time: ____________ Finish time: ____________ °Date: ____________ Movements: ____________ Start time: ____________ Finish time: ____________ °Date: ____________ Movements: ____________ Start time: ____________ Finish time: ____________ °Date: ____________ Movements: ____________ Start time: ____________ Finish time: ____________  °Date: ____________ Movements: ____________ Start time: ____________ Finish time: ____________ °Date: ____________ Movements: ____________ Start time: ____________ Finish time: ____________ °Date: ____________ Movements: ____________ Start time: ____________ Finish time: ____________ °Date: ____________ Movements: ____________ Start time: ____________ Finish time: ____________ °Date: ____________ Movements: ____________ Start time: ____________ Finish time: ____________ °Date: ____________ Movements: ____________ Start time: ____________ Finish time: ____________ °Date: ____________ Movements: ____________  Start time: ____________ Finish time: ____________  °Date: ____________ Movements: ____________ Start time: ____________ Finish time: ____________ °Date: ____________ Movements: ____________ Start time: ____________ Finish time: ____________ °Date: ____________ Movements: ____________ Start time: ____________ Finish time: ____________ °Date: ____________ Movements: ____________ Start time: ____________ Finish time: ____________ °Date: ____________ Movements: ____________ Start time: ____________ Finish time: ____________ °Date: ____________ Movements: ____________ Start time: ____________ Finish time: ____________ °Document Released: 12/29/2006 Document Revised: 04/15/2014 Document Reviewed: 09/25/2012 °[employer]® Patient Information ©2015 [employer]. This information is not intended to replace advice given to you by your health care provider. Make sure you discuss any questions you have with your health care provider. ° °

## 2014-07-30 NOTE — MAU Provider Note (Signed)
[name] is a 28 y.o. [id number] at [id number]  who presents to MAU today complaining contractions and increased watery discharge. She denies vaginal bleeding.   BP 121/72  Pulse 92  Temp(Src) 98.7 F (37.1 C) (Oral)  Resp 18  Ht 5\' 7"  (1.702 m)  Wt 182 lb (82.555 kg)  BMI 28.50 kg/m2  SpO2 99% GENERAL: Well-developed, well-nourished female in no acute distress.  HEENT: Normocephalic, atraumatic.  LUNGS: Normal effort HEART: Regular rate  ABDOMEN: Soft, nontender, nondistended.  PELVIC: Normal external female genitalia. Vagina is pink and rugated.  Normal discharge.  negative pooling. Gravid uterus.   EXTREMITIES: No cyanosis, clubbing, or edema  Fern - negative  Fetal Monitoring:  Baseline:130 bpm, moderate variability, + accelerations, no decelerations Contractions: occasional, irregular  A: Membranes intact  P: Report given to RN to contact MD on call for further instructions  Freddi StarrJulie N Ethier, PA-C 07/30/2014 10:45 PM

## 2014-07-30 NOTE — MAU Note (Signed)
Pt states she has been having contractions since 1800 today. Contractions last about 1 hour then they go away for about " 'as per pt.

## 2014-07-30 NOTE — MAU Note (Signed)
contractions 

## 2014-08-01 ENCOUNTER — Inpatient Hospital Stay (HOSPITAL_COMMUNITY): Payer: Medicaid Other

## 2014-08-01 ENCOUNTER — Inpatient Hospital Stay (HOSPITAL_COMMUNITY)
Admission: AD | Admit: 2014-08-01 | Discharge: 2014-08-01 | Disposition: A | Discharge status: Home / Self Care | Payer: Medicaid Other | Source: Ambulatory Visit | Attending: Obstetrics and Gynecology | Admitting: Obstetrics and Gynecology

## 2014-08-01 ENCOUNTER — Encounter (HOSPITAL_COMMUNITY): Payer: Self-pay | Admitting: *Deleted

## 2014-08-01 DIAGNOSIS — O479 False labor, unspecified: Secondary | ICD-10-CM | POA: Insufficient documentation

## 2014-08-01 DIAGNOSIS — O288 Other abnormal findings on antenatal screening of mother: Secondary | ICD-10-CM

## 2014-08-01 NOTE — Discharge Instructions (Signed)
Braxton Hicks Contractions °Contractions of the uterus can occur throughout pregnancy. Contractions are not always a sign that you are in labor.  °WHAT ARE BRAXTON HICKS CONTRACTIONS?  °Contractions that occur before labor are called Braxton Hicks contractions, or false labor. Toward the end of pregnancy (32-34 weeks), these contractions can develop more often and may become more forceful. This is not true labor because these contractions do not result in opening (dilatation) and thinning of the cervix. They are sometimes difficult to tell apart from true labor because these contractions can be forceful and people have different pain tolerances. You should not feel embarrassed if you go to the hospital with false labor. Sometimes, the only way to tell if you are in true labor is for your health care provider to look for changes in the cervix. °If there are no prenatal problems or other health problems associated with the pregnancy, it is completely safe to be sent home with false labor and await the onset of true labor. °HOW CAN YOU TELL THE DIFFERENCE BETWEEN TRUE AND FALSE LABOR? °False Labor °· The contractions of false labor are usually shorter and not as hard as those of true labor.   °· The contractions are usually irregular.   °· The contractions are often felt in the front of the lower abdomen and in the groin.   °· The contractions may go away when you walk around or change positions while lying down.   °· The contractions get weaker and are shorter lasting as time goes on.   °· The contractions do not usually become progressively stronger, regular, and closer together as with true labor.   °True Labor °· Contractions in true labor last 30-70 seconds, become very regular, usually become more intense, and increase in frequency.   °· The contractions do not go away with walking.   °· The discomfort is usually felt in the top of the uterus and spreads to the lower abdomen and low back.   °· True labor can be  determined by your health care provider with an exam. This will show that the cervix is dilating and getting thinner.   °WHAT TO REMEMBER °· Keep up with your usual exercises and follow other instructions given by your health care provider.   °· Take medicines as directed by your health care provider.   °· Keep your regular prenatal appointments.   °· Eat and drink lightly if you think you are going into labor.   °· If Braxton Hicks contractions are making you uncomfortable:   °¨ Change your position from lying down or resting to walking, or from walking to resting.   °¨ Sit and rest in a tub of warm water.   °¨ Drink 2-3 glasses of water. Dehydration may cause these contractions.   °¨ Do slow and deep breathing several times an hour.   °WHEN SHOULD I SEEK IMMEDIATE MEDICAL CARE? °Seek immediate medical care if: °· Your contractions become stronger, more regular, and closer together.   °· You have fluid leaking or gushing from your vagina.   °· You have a fever.   °· You pass blood-tinged mucus.   °· You have vaginal bleeding.   °· You have continuous abdominal pain.   °· You have low back pain that you never had before.   °· You feel your baby's head pushing down and causing pelvic pressure.   °· Your baby is not moving as much as it used to.   °Document Released: 11/29/2005 Document Revised: 12/04/2013 Document Reviewed: 09/10/2013 °[employer]® Patient Information ©2015 [employer]. This information is not intended to replace advice given to you by your health care   provider. Make sure you discuss any questions you have with your health care provider. ° °

## 2014-08-01 NOTE — MAU Note (Signed)
Patient reports contractions on and off all day. They are now less than 5 minutes apart. Denies vaginal bleeding and leaking of fluid. Was last check yesterday in the office- 3 cm.

## 2014-08-01 NOTE — Progress Notes (Signed)
FHT from early this am reviewed.  Tracing is reassuring but not reactive, BPP 8/8, irreg ctx.

## 2014-08-02 ENCOUNTER — Inpatient Hospital Stay (HOSPITAL_COMMUNITY)
Admission: AD | Admit: 2014-08-02 | Discharge: 2014-08-04 | DRG: 775 | Disposition: A | Discharge status: Home / Self Care | Payer: Medicaid Other | Source: Ambulatory Visit | Attending: Obstetrics and Gynecology | Admitting: Obstetrics and Gynecology

## 2014-08-02 ENCOUNTER — Encounter (HOSPITAL_COMMUNITY): Payer: Medicaid Other | Admitting: Anesthesiology

## 2014-08-02 ENCOUNTER — Encounter (HOSPITAL_COMMUNITY): Payer: Self-pay | Admitting: *Deleted

## 2014-08-02 ENCOUNTER — Telehealth (HOSPITAL_COMMUNITY): Payer: Self-pay | Admitting: *Deleted

## 2014-08-02 ENCOUNTER — Inpatient Hospital Stay (HOSPITAL_COMMUNITY): Payer: Medicaid Other | Admitting: Anesthesiology

## 2014-08-02 DIAGNOSIS — Z801 Family history of malignant neoplasm of trachea, bronchus and lung: Secondary | ICD-10-CM | POA: Diagnosis not present

## 2014-08-02 DIAGNOSIS — O479 False labor, unspecified: Secondary | ICD-10-CM | POA: Diagnosis present

## 2014-08-02 DIAGNOSIS — Z87891 Personal history of nicotine dependence: Secondary | ICD-10-CM | POA: Diagnosis not present

## 2014-08-02 DIAGNOSIS — Z833 Family history of diabetes mellitus: Secondary | ICD-10-CM

## 2014-08-02 DIAGNOSIS — Z3490 Encounter for supervision of normal pregnancy, unspecified, unspecified trimester: Secondary | ICD-10-CM

## 2014-08-02 LAB — CBC
HCT: 34.9 % — ABNORMAL LOW (ref 36.0–46.0)
Hemoglobin: 11.7 g/dL — ABNORMAL LOW (ref 12.0–15.0)
MCH: 28 pg (ref 26.0–34.0)
MCHC: 33.5 g/dL (ref 30.0–36.0)
MCV: 83.5 fL (ref 78.0–100.0)
PLATELETS: 145 10*3/uL — AB (ref 150–400)
RBC: 4.18 MIL/uL (ref 3.87–5.11)
RDW: 14.9 % (ref 11.5–15.5)
WBC: 9.4 10*3/uL (ref 4.0–10.5)

## 2014-08-02 MED ORDER — EPHEDRINE 5 MG/ML INJ
10.0000 mg | INTRAVENOUS | Status: DC | PRN
  Filled 2014-08-02: qty 2

## 2014-08-02 MED ORDER — LIDOCAINE HCL (PF) 1 % IJ SOLN
INTRAMUSCULAR | Status: DC | PRN
  Administered 2014-08-02 (×2): 9 mL

## 2014-08-02 MED ORDER — PHENYLEPHRINE 40 MCG/ML (10ML) SYRINGE FOR IV PUSH (FOR BLOOD PRESSURE SUPPORT)
80.0000 ug | PREFILLED_SYRINGE | INTRAVENOUS | Status: DC | PRN
  Filled 2014-08-02: qty 2

## 2014-08-02 MED ORDER — PHENYLEPHRINE 40 MCG/ML (10ML) SYRINGE FOR IV PUSH (FOR BLOOD PRESSURE SUPPORT)
PREFILLED_SYRINGE | INTRAVENOUS | Status: AC
  Filled 2014-08-02: qty 10

## 2014-08-02 MED ORDER — FENTANYL 2.5 MCG/ML BUPIVACAINE 1/10 % EPIDURAL INFUSION (WH - ANES)
INTRAMUSCULAR | Status: DC | PRN
  Administered 2014-08-02: 14 mL/h via EPIDURAL

## 2014-08-02 MED ORDER — BUTORPHANOL TARTRATE 1 MG/ML IJ SOLN
INTRAMUSCULAR | Status: AC
  Filled 2014-08-02: qty 1

## 2014-08-02 MED ORDER — DIPHENHYDRAMINE HCL 50 MG/ML IJ SOLN: 12.5000 mg | INTRAMUSCULAR | Status: DC | PRN

## 2014-08-02 MED ORDER — LACTATED RINGERS IV SOLN: INTRAVENOUS | Status: DC

## 2014-08-02 MED ORDER — FENTANYL 2.5 MCG/ML BUPIVACAINE 1/10 % EPIDURAL INFUSION (WH - ANES)
INTRAMUSCULAR | Status: AC
  Filled 2014-08-02: qty 125

## 2014-08-02 MED ORDER — OXYCODONE-ACETAMINOPHEN 5-325 MG PO TABS: 1.0000 | ORAL_TABLET | ORAL | Status: DC | PRN

## 2014-08-02 MED ORDER — IBUPROFEN 600 MG PO TABS
600.0000 mg | ORAL_TABLET | Freq: Four times a day (QID) | ORAL | Status: DC | PRN
  Administered 2014-08-03: 600 mg via ORAL
  Filled 2014-08-02: qty 1

## 2014-08-02 MED ORDER — BUTORPHANOL TARTRATE 1 MG/ML IJ SOLN
1.0000 mg | Freq: Once | INTRAMUSCULAR | Status: AC
  Administered 2014-08-02: 1 mg via INTRAVENOUS

## 2014-08-02 MED ORDER — LACTATED RINGERS IV SOLN
500.0000 mL | INTRAVENOUS | Status: DC | PRN
Start: 2014-08-02 — End: 2014-08-03

## 2014-08-02 MED ORDER — OXYTOCIN BOLUS FROM INFUSION: 500.0000 mL | INTRAVENOUS | Status: DC

## 2014-08-02 MED ORDER — OXYTOCIN 40 UNITS IN LACTATED RINGERS INFUSION - SIMPLE MED
62.5000 mL/h | INTRAVENOUS | Status: DC
  Filled 2014-08-02: qty 1000

## 2014-08-02 MED ORDER — LIDOCAINE HCL (PF) 1 % IJ SOLN
30.0000 mL | INTRAMUSCULAR | Status: DC | PRN
  Filled 2014-08-02: qty 30

## 2014-08-02 MED ORDER — FENTANYL 2.5 MCG/ML BUPIVACAINE 1/10 % EPIDURAL INFUSION (WH - ANES)
14.0000 mL/h | INTRAMUSCULAR | Status: DC | PRN
Start: 2014-08-02 — End: 2014-08-03
  Administered 2014-08-02: 14 mL/h via EPIDURAL

## 2014-08-02 MED ORDER — LACTATED RINGERS IV SOLN: 500.0000 mL | Freq: Once | INTRAVENOUS | Status: DC

## 2014-08-02 NOTE — Anesthesia Preprocedure Evaluation (Signed)
Anesthesia Evaluation  Patient identified by MRN, date of birth, ID band Patient awake    Reviewed: Allergy & Precautions, H&P , NPO status , Patient's Chart, lab work & pertinent test results  Airway Mallampati: I TM Distance: >3 FB Neck ROM: full    Dental no notable dental hx.    Pulmonary Current Smoker,    Pulmonary exam normal       Cardiovascular negative cardio ROS      Neuro/Psych negative neurological ROS     GI/Hepatic negative GI ROS, Neg liver ROS,   Endo/Other  negative endocrine ROS  Renal/GU negative Renal ROS     Musculoskeletal   Abdominal Normal abdominal exam  (+)   Peds  Hematology negative hematology ROS (+)   Anesthesia Other Findings   Reproductive/Obstetrics (+) Pregnancy                           Anesthesia Physical Anesthesia Plan  ASA: II  Anesthesia Plan: Epidural   Post-op Pain Management:    Induction:   Airway Management Planned:   Additional Equipment:   Intra-op Plan:   Post-operative Plan:   Informed Consent: I have reviewed the patients History and Physical, chart, labs and discussed the procedure including the risks, benefits and alternatives for the proposed anesthesia with the patient or authorized representative who has indicated his/her understanding and acceptance.     Plan Discussed with:   Anesthesia Plan Comments:         Anesthesia Quick Evaluation  

## 2014-08-02 NOTE — H&P (Signed)
April Schaefer:  Saur, Anel               ACCOUNT NO.:  [id number]  MEDICAL RECORD NO.:  [id number]  LOCATION:  9163                          FACILITY:  [facility]  PHYSICIAN:  Malachi Prohomas F. Ambrose MantleHenley, M.D. DATE OF BIRTH:  May 26, 1986  DATE OF ADMISSION:  08/02/2014 DATE OF DISCHARGE:                             HISTORY & PHYSICAL   PRESENT ILLNESS:  This is a 28 year old black female, para 3-0-3-3, gravida 177, [facility].  Blood group and type O negative with negative antibody screen.  RPR negative.  Urine culture positive, treated with Keflex.  Hepatitis B surface antigen negative, HIV negative, GC and Chlamydia negative.  Rubella immune.  AFP normal. One-hour Glucola 121, repeat HIV and RPR negative.  GC and Chlamydia negative.  Group B strep negative.  The patient began her prenatal course in our office at 13 weeks and 6 days.  She had an ultrasound on February 12, 2014, 15 weeks and 0 days[facility]  The patient's prenatal course was somewhat uncomplicated.  Her initial weight was 186. She failed to gain any weight.  She actually lost 3 pounds from 186-183. At her last prenatal visit on July 31, 2014, her cervix was 3 cm, 50%, vertex at a -2.  Membranes were stripped.  Her blood pressure was 102/80.  PAST MEDICAL HISTORY:  Reveals a pigmentation problem.  She does have a history of bursitis in her right hip, anxiety attacks and allergic rhinitis.  SURGICAL HISTORY:  None recorded.  ALLERGIES:  TO CLINDAMYCIN CAUSED HIVES; METRONIDAZOLE CAUSED HIVES, SWELLING WELTS; NORCO CAUSED HIVES.  No latex allergy.  No food allergies.  SOCIAL HISTORY:  The patient smoked less than 5 a day.  Did not drink. Denied drugs.  African American, 2 years of college.  Works as a Production assistant, radioserver at [employer].  Active lifestyle, separated, father of the baby not in the relationship.  FAMILY HISTORY:  Paternal grandfather with cancer of the lung; paternal grandmother, diabetes;  paternal aunt, thyroid dysfunction.  PHYSICAL EXAMINATION:  VITAL SIGNS:  On admission, normal. HEART:  Normal size and sounds.  No murmurs. LUNGS:  Clear to auscultation.  At her last prenatal visit on July 31, 2014, her fundal height was 39.5 cm, cervix was 3 cm, 50%, vertex -2. On the nurse's exam in the Maternity Admission Unit tonight, her cervix was 5 cm, 90%, and the patient requesting an epidural.  She has been admitted and there has been some delay in getting an IV started, so blood work is not available for the epidural.  She is asking for IV pain medicine and given 1 mg of Stadol.  ADMITTING IMPRESSION:  Intrauterine pregnancy at 39 weeks and 2 days, active labor.     Malachi Prohomas F. Ambrose MantleHenley, M.D.     TFH/MEDQ  D:  08/02/2014  T:  08/02/2014  Job:  [id number]

## 2014-08-02 NOTE — Telephone Encounter (Signed)
Preadmission screen  

## 2014-08-02 NOTE — MAU Note (Signed)
Pt  Says she was in office on wed -  3 cm.   DENIES HSV AND MRSA.   GBS-   NEG

## 2014-08-02 NOTE — Anesthesia Procedure Notes (Signed)
Epidural Patient location during procedure: OB Start time: 08/02/2014 10:49 PM End time: 08/02/2014 10:53 PM  Staffing Anesthesiologist: Leilani AbleHATCHETT, Braidan Ricciardi Performed by: anesthesiologist   Preanesthetic Checklist Completed: patient identified, surgical consent, pre-op evaluation, timeout performed, IV checked, risks and benefits discussed and monitors and equipment checked  Epidural Patient position: sitting Prep: site prepped and draped and DuraPrep Patient monitoring: continuous pulse ox and blood pressure Approach: midline Location: L3-L4 Injection technique: LOR air  Needle:  Needle type: Tuohy  Needle gauge: 17 G Needle length: 9 cm and 9 Needle insertion depth: 4 cm Catheter type: closed end flexible Catheter size: 19 Gauge Catheter at skin depth: 10 cm Test dose: negative and Other  Assessment Sensory level: T9 Events: blood not aspirated, injection not painful, no injection resistance, negative IV test and no paresthesia  Additional Notes Reason for block:procedure for pain

## 2014-08-02 NOTE — Progress Notes (Addendum)
Patient ID: April CrockerSacelia M Sabel, female   DOB: 08/20/1986, 28 y.o.   MRN: [id number] Pt received her epidural and has good relief. The cervix is 9 cm 90% effaced and the vertex is at 0 station. AROM PRODUCED CLEAR FLUID. The contractions are q 2 minutes and the FHR tracing is normal.

## 2014-08-03 ENCOUNTER — Encounter (HOSPITAL_COMMUNITY): Payer: Self-pay | Admitting: *Deleted

## 2014-08-03 LAB — CBC
HCT: 31.9 % — ABNORMAL LOW (ref 36.0–46.0)
HEMOGLOBIN: 10.5 g/dL — AB (ref 12.0–15.0)
MCH: 27.6 pg (ref 26.0–34.0)
MCHC: 32.9 g/dL (ref 30.0–36.0)
MCV: 83.7 fL (ref 78.0–100.0)
Platelets: 155 10*3/uL (ref 150–400)
RBC: 3.81 MIL/uL — ABNORMAL LOW (ref 3.87–5.11)
RDW: 14.8 % (ref 11.5–15.5)
WBC: 12.9 10*3/uL — ABNORMAL HIGH (ref 4.0–10.5)

## 2014-08-03 LAB — TYPE AND SCREEN
ABO/RH(D): O NEG
ANTIBODY SCREEN: NEGATIVE

## 2014-08-03 LAB — RPR

## 2014-08-03 MED ORDER — LANOLIN HYDROUS EX OINT: TOPICAL_OINTMENT | CUTANEOUS | Status: DC | PRN

## 2014-08-03 MED ORDER — SENNOSIDES-DOCUSATE SODIUM 8.6-50 MG PO TABS
2.0000 | ORAL_TABLET | ORAL | Status: DC
  Administered 2014-08-03: 2 via ORAL
  Filled 2014-08-03: qty 2

## 2014-08-03 MED ORDER — MEASLES, MUMPS & RUBELLA VAC ~~LOC~~ INJ
0.5000 mL | INJECTION | Freq: Once | SUBCUTANEOUS | Status: DC
  Filled 2014-08-03: qty 0.5

## 2014-08-03 MED ORDER — DIPHENHYDRAMINE HCL 25 MG PO CAPS: 25.0000 mg | ORAL_CAPSULE | Freq: Four times a day (QID) | ORAL | Status: DC | PRN

## 2014-08-03 MED ORDER — LACTATED RINGERS IV SOLN: INTRAVENOUS | Status: AC

## 2014-08-03 MED ORDER — WITCH HAZEL-GLYCERIN EX PADS: 1.0000 "application " | MEDICATED_PAD | CUTANEOUS | Status: DC | PRN

## 2014-08-03 MED ORDER — OXYTOCIN 40 UNITS IN LACTATED RINGERS INFUSION - SIMPLE MED: 62.5000 mL/h | INTRAVENOUS | Status: AC | PRN

## 2014-08-03 MED ORDER — RHO D IMMUNE GLOBULIN 1500 UNIT/2ML IJ SOSY
300.0000 ug | PREFILLED_SYRINGE | Freq: Once | INTRAMUSCULAR | Status: AC
  Administered 2014-08-04: 300 ug via INTRAMUSCULAR
  Filled 2014-08-03: qty 2

## 2014-08-03 MED ORDER — TETANUS-DIPHTH-ACELL PERTUSSIS 5-2.5-18.5 LF-MCG/0.5 IM SUSP: 0.5000 mL | Freq: Once | INTRAMUSCULAR | Status: DC

## 2014-08-03 MED ORDER — DIBUCAINE 1 % RE OINT: 1.0000 "application " | TOPICAL_OINTMENT | RECTAL | Status: DC | PRN

## 2014-08-03 MED ORDER — PRENATAL MULTIVITAMIN CH
1.0000 | ORAL_TABLET | Freq: Every day | ORAL | Status: DC
  Administered 2014-08-04: 1 via ORAL
  Filled 2014-08-03 (×2): qty 1

## 2014-08-03 MED ORDER — OXYCODONE-ACETAMINOPHEN 5-325 MG PO TABS
1.0000 | ORAL_TABLET | ORAL | Status: DC | PRN
  Administered 2014-08-03 – 2014-08-04 (×7): 1 via ORAL
  Filled 2014-08-03 (×7): qty 1

## 2014-08-03 MED ORDER — ZOLPIDEM TARTRATE 5 MG PO TABS: 5.0000 mg | ORAL_TABLET | Freq: Every evening | ORAL | Status: DC | PRN

## 2014-08-03 MED ORDER — PRENATAL MULTIVITAMIN CH
1.0000 | ORAL_TABLET | Freq: Every day | ORAL | Status: DC
  Administered 2014-08-03: 1 via ORAL

## 2014-08-03 MED ORDER — IBUPROFEN 600 MG PO TABS
600.0000 mg | ORAL_TABLET | Freq: Four times a day (QID) | ORAL | Status: DC
  Administered 2014-08-03 – 2014-08-04 (×5): 600 mg via ORAL
  Filled 2014-08-03 (×5): qty 1

## 2014-08-03 MED ORDER — ONDANSETRON HCL 4 MG PO TABS: 4.0000 mg | ORAL_TABLET | ORAL | Status: DC | PRN

## 2014-08-03 MED ORDER — ONDANSETRON HCL 4 MG/2ML IJ SOLN: 4.0000 mg | INTRAMUSCULAR | Status: DC | PRN

## 2014-08-03 MED ORDER — SIMETHICONE 80 MG PO CHEW: 80.0000 mg | CHEWABLE_TABLET | ORAL | Status: DC | PRN

## 2014-08-03 MED ORDER — BENZOCAINE-MENTHOL 20-0.5 % EX AERO
1.0000 "application " | INHALATION_SPRAY | CUTANEOUS | Status: DC | PRN
  Administered 2014-08-03: 1 via TOPICAL
  Filled 2014-08-03: qty 56

## 2014-08-03 NOTE — Progress Notes (Signed)
Patient ID: April Schaefer, female   DOB: 07/19/1986, 28 y.o.   MRN: [id number] DOD no complaints HGB stable.

## 2014-08-03 NOTE — Progress Notes (Signed)
Attempted visit with mother.  She had multiple visitors both times and asked CSW to return at a different time.  Will follow up tomorrow. 

## 2014-08-03 NOTE — Anesthesia Postprocedure Evaluation (Signed)
Anesthesia Post Note  Patient: April CrockerSacelia M Absher  Procedure(s) Performed: * No procedures listed *  Anesthesia type: Epidural  Patient location: Mother/Baby  Post pain: Pain level controlled  Post assessment: Post-op Vital signs reviewed  Last Vitals:  Filed Vitals:   08/03/14 0726  BP: 115/60  Pulse: 57  Temp: 36.9 C  Resp: 20    Post vital signs: Reviewed  Level of consciousness:alert  Complications: No apparent anesthesia complications

## 2014-08-03 NOTE — Progress Notes (Signed)
Patient ID: April Schaefer, female   DOB: 03/29/1986, 28 y.o.   MRN: [id number] Delivery note:  The pt progressed rapidly to full dilatation and pushed well. She delivered spontaneously LOT a living female infant with Apgars of 9 and 9 at 1 and 5 minutes. The placenta was intact and the uterus was normal. There was a small left labial laceration that was hemostatic and not repaired. EBL 300 cc's.

## 2014-08-04 MED ORDER — OXYCODONE-ACETAMINOPHEN 5-325 MG PO TABS: 1.0000 | ORAL_TABLET | Freq: Four times a day (QID) | ORAL | Status: DC | PRN

## 2014-08-04 MED ORDER — IBUPROFEN 600 MG PO TABS: 600.0000 mg | ORAL_TABLET | Freq: Four times a day (QID) | ORAL | Status: DC | PRN

## 2014-08-04 NOTE — Progress Notes (Signed)
Mother sleeping with infant lying on pillow in her bed. Mother educated on safe infant sleep. Infant placed in bassinet after assessment and weight done.

## 2014-08-04 NOTE — Progress Notes (Signed)
Patient continues to sleep with infant in bed.

## 2014-08-04 NOTE — Discharge Instructions (Signed)
booklet °

## 2014-08-04 NOTE — Discharge Summary (Signed)
April Schaefer, April Schaefer               ACCOUNT NO.:  [id number]  MEDICAL RECORD NO.:  [id number]  LOCATION:  9124                          [facility]ILITY:  [facility]  PHYSICIAN:  Malachi Pro. Ambrose Mantle, M.D. DATE OF BIRTH:  [birth year]  DATE OF ADMISSION:  08/02/2014 DATE OF DISCHARGE:  08/04/2014                              DISCHARGE SUMMARY   This is a 28 year old black female, para 3-0-3-3, gravida 7, [facility] August 07, 2014, admitted in labor.  Blood group and type O negative, negative antibody.  She had a urine culture and pregnancy positive, treated with Keflex.  All other lab tests were normal.  She came to the hospital having active contractions.  Cervix was 5 cm, 90%.  The patient requested an epidural, the patient did receive an epidural.  After receiving the epidural, she was 9 cm, 90% effaced, vertex at 0 station. Artificial rupture of the membranes produced clear fluid.  The patient progressed to full dilatation and pushed well and delivered spontaneously LOT, a living female infant with Apgars of 9 and 9 at 1 and 5 minutes.  Small left labial laceration was hemostatic and not repaired.  Postpartum, the patient did very well.  She has 3 children at home, so she requested discharge on the first postop day.  Initial hemoglobin 11.7, hematocrit 34.9, white count 9400, platelet count 145,000.  Followup hemoglobin 10.5.  Patient is O negative.  RPR is nonreactive.  The patient received RhoGAM prior to discharge.  FINAL DIAGNOSES:  Intrauterine pregnancy at term, delivered LOT.  OPERATION:  Spontaneous delivery LOT.  FINAL CONDITION:  Improved.  INSTRUCTIONS:  Include our regular discharge instruction booklet as well as the after visit summary.  She is given a prescription for Percocet 5/325, #30 tablets and Motrin 600 mg, #30 tablets, both to take 1 every 6 hours as needed for pain and return to the office in 6 weeks for followup examination.     Malachi Pro. Ambrose Mantle, M.D.     TFH/MEDQ  D:   08/04/2014  T:  08/04/2014  Job:  [id number]

## 2014-08-04 NOTE — Progress Notes (Signed)
Patient ID: April Schaefer, female   DOB: 1986-03-29, 28 y.o.   MRN: [id number] #1 afebrile BP normal has 3 children at home Wants d/c

## 2014-08-04 NOTE — Progress Notes (Addendum)
Mother continues to keep infant in bed while sleeping so re-educated again on infant safe sleep.

## 2014-08-05 LAB — RH IG WORKUP (INCLUDES ABO/RH)
ABO/RH(D): O NEG
Fetal Screen: NEGATIVE
Gestational Age(Wks): 39.3
Unit division: 0

## 2014-08-05 NOTE — Progress Notes (Signed)
Post discharge chart review completed.  

## 2014-08-06 ENCOUNTER — Inpatient Hospital Stay (HOSPITAL_COMMUNITY): Admission: RE | Admit: 2014-08-06 | Payer: Medicaid Other | Source: Ambulatory Visit

## 2014-10-14 ENCOUNTER — Encounter (HOSPITAL_COMMUNITY): Payer: Self-pay | Admitting: *Deleted

## 2015-04-14 ENCOUNTER — Encounter (HOSPITAL_COMMUNITY): Payer: Self-pay

## 2015-04-14 ENCOUNTER — Emergency Department (HOSPITAL_COMMUNITY)
Admission: EM | Admit: 2015-04-14 | Discharge: 2015-04-14 | Disposition: A | Discharge status: Home / Self Care | Payer: Medicaid Other | Attending: Emergency Medicine | Admitting: Emergency Medicine

## 2015-04-14 DIAGNOSIS — Y998 Other external cause status: Secondary | ICD-10-CM | POA: Insufficient documentation

## 2015-04-14 DIAGNOSIS — Y9289 Other specified places as the place of occurrence of the external cause: Secondary | ICD-10-CM | POA: Insufficient documentation

## 2015-04-14 DIAGNOSIS — Z8659 Personal history of other mental and behavioral disorders: Secondary | ICD-10-CM | POA: Diagnosis not present

## 2015-04-14 DIAGNOSIS — Y9389 Activity, other specified: Secondary | ICD-10-CM | POA: Diagnosis not present

## 2015-04-14 DIAGNOSIS — Z8739 Personal history of other diseases of the musculoskeletal system and connective tissue: Secondary | ICD-10-CM | POA: Diagnosis not present

## 2015-04-14 DIAGNOSIS — L299 Pruritus, unspecified: Secondary | ICD-10-CM | POA: Diagnosis not present

## 2015-04-14 DIAGNOSIS — T7840XA Allergy, unspecified, initial encounter: Secondary | ICD-10-CM | POA: Insufficient documentation

## 2015-04-14 DIAGNOSIS — Z720 Tobacco use: Secondary | ICD-10-CM | POA: Diagnosis not present

## 2015-04-14 MED ORDER — DIPHENHYDRAMINE HCL 25 MG PO CAPS
25.0000 mg | ORAL_CAPSULE | Freq: Once | ORAL | Status: AC
  Administered 2015-04-14: 25 mg via ORAL
  Filled 2015-04-14: qty 1

## 2015-04-14 NOTE — ED Provider Notes (Signed)
CSN: [id number]     Arrival date & time 04/14/15  1209 History   First MD Initiated Contact with Patient 04/14/15 1300     Chief Complaint  Patient presents with  . Allergic Reaction     (Consider location/radiation/quality/duration/timing/severity/associated sxs/prior Treatment) HPI Comments: Patient presents to the emergency department with chief complaint of allergic reaction. She states that after she dropped off her children at school today she became very itchy all over. She states that the itching started on her head, and spread to her trunk and extremities. She denies any difficulty breathing or swallowing. Denies any nausea, vomiting, or diarrhea. States that she used a new soap yesterday. She states that the itching caused her to have a panic attack today. She believes that the panic attack caused the itching to worsen. She denies any other aggravating or alleviating factors. She has not taken anything to alleviate her symptoms.  The history is provided by the patient. No language interpreter was used.    Past Medical History  Diagnosis Date  . Medical history non-contributory   . Vaginal Pap smear, abnormal   . Anxiety   . Hx of bursitis     R hip   Past Surgical History  Procedure Laterality Date  . Colposcopy     Family History  Problem Relation Age of Onset  . Alcohol abuse Neg Hx   . Arthritis Neg Hx   . Asthma Neg Hx   . Birth defects Neg Hx   . Cancer Neg Hx   . COPD Neg Hx   . Depression Neg Hx   . Diabetes Neg Hx   . Drug abuse Neg Hx   . Early death Neg Hx   . Hearing loss Neg Hx   . Heart disease Neg Hx   . Hyperlipidemia Neg Hx   . Hypertension Neg Hx   . Kidney disease Neg Hx   . Learning disabilities Neg Hx   . Mental illness Neg Hx   . Mental retardation Neg Hx   . Miscarriages / Stillbirths Neg Hx   . Stroke Neg Hx   . Vision loss Neg Hx   . Varicose Veins Neg Hx    History  Substance Use Topics  . Smoking status: Light Tobacco Smoker --  0.25 packs/day    Types: Cigarettes  . Smokeless tobacco: Not on file  . Alcohol Use: Yes   OB History    Gravida Para Term Preterm AB TAB SAB Ectopic Multiple Living   Review of Systems  Constitutional: Negative for fever and chills.  Respiratory: Negative for shortness of breath.   Cardiovascular: Negative for chest pain.  Gastrointestinal: Negative for nausea, vomiting, diarrhea and constipation.  Genitourinary: Negative for dysuria.      Allergies  Clindamycin/lincomycin; Metronidazole; and Nickel  Home Medications   Prior to Admission medications   Medication Sig Start Date End Date Taking? Authorizing Provider  ibuprofen (ADVIL,MOTRIN) 600 MG tablet Take 1 tablet (600 mg total) by mouth every 6 (six) hours as needed. 08/04/14   Tracey Harries, MD  oxyCODONE-acetaminophen (PERCOCET/ROXICET) 5-325 MG per tablet Take 1 tablet by mouth every 6 (six) hours as needed for moderate pain. 08/04/14   Tracey Harries, MD  Prenatal Vit-Fe Fumarate-FA (PRENATAL MULTIVITAMIN) TABS tablet Take 1 tablet by mouth daily.    Historical Provider, MD   BP 140/71 mmHg  Pulse 77  Temp(Src) 98.4 F (36.9 C)  Resp 20  Ht 5\' 7"  (1.702 m)  Wt 170 lb (77.111 kg)  BMI 26.62 kg/m2  SpO2 100%  LMP 03/23/2015 Physical Exam  Constitutional: She is oriented to person, place, and time. She appears well-developed and well-nourished.  HENT:  Head: Normocephalic and atraumatic.  Oropharynx clear, no stridor, no swelling or airway obstruction  Eyes: Conjunctivae and EOM are normal. Pupils are equal, round, and reactive to light.  Neck: Normal range of motion. Neck supple.  Cardiovascular: Normal rate and regular rhythm.  Exam reveals no gallop and no friction rub.   No murmur heard. Pulmonary/Chest: Effort normal and breath sounds normal. No respiratory distress. She has no wheezes. She has no rales. She exhibits no tenderness.  Clear to auscultation bilaterally  Abdominal: Soft.  Bowel sounds are normal. She exhibits no distension and no mass. There is no tenderness. There is no rebound and no guarding.  Musculoskeletal: Normal range of motion. She exhibits no edema or tenderness.  Neurological: She is alert and oriented to person, place, and time.  Skin: Skin is warm and dry. No rash noted.  No evidence of rash, hives, or erythema  Psychiatric: She has a normal mood and affect. Her behavior is normal. Judgment and thought content normal.  Nursing note and vitals reviewed.   ED Course  Procedures (including critical care time) Labs Review Labs Reviewed - No data to display  Imaging Review No results found.   EKG Interpretation None      MDM   Final diagnoses:  Allergic reaction, initial encounter    Patient with allergic reaction, presumably to new shower soap. Advised patient to discontinue use. Will give Benadryl in the ED. Recommend Benadryl for the next several days. Patient is not breast-feeding. Patient is well-appearing. No wheezing, oral swelling, or signs of anaphylaxis. She is not in any apparent distress. Discharged home with primary care follow-up.    Roxy Horsemanobert Sharde Gover, PA-C 04/14/15 1406  Mirian MoMatthew Gentry, MD 04/15/15 [phone]

## 2015-04-14 NOTE — ED Notes (Signed)
Pt. Woke up and was taking her children to school and became very itchy  ;    Pt. Is itching all over.   Airway is patent,  No swelling noted to tongue or throat.  Pt. Is also having a panic attacks X 2 today.  Pt. Went to work and was there an hour and had 2 panic attacks and the itching got worse. GCS 15.  No hives noted.

## 2015-04-14 NOTE — Discharge Instructions (Signed)

## 2015-04-14 NOTE — ED Notes (Signed)
Declined W/C at D/C and was escorted to lobby by RN. 

## 2015-07-21 ENCOUNTER — Emergency Department (HOSPITAL_COMMUNITY)
Admission: EM | Admit: 2015-07-21 | Discharge: 2015-07-21 | Disposition: A | Discharge status: Home / Self Care | Payer: Medicaid Other | Attending: Emergency Medicine | Admitting: Emergency Medicine

## 2015-07-21 ENCOUNTER — Encounter (HOSPITAL_COMMUNITY): Payer: Self-pay | Admitting: Emergency Medicine

## 2015-07-21 DIAGNOSIS — W57XXXA Bitten or stung by nonvenomous insect and other nonvenomous arthropods, initial encounter: Secondary | ICD-10-CM | POA: Diagnosis not present

## 2015-07-21 DIAGNOSIS — Z79899 Other long term (current) drug therapy: Secondary | ICD-10-CM | POA: Insufficient documentation

## 2015-07-21 DIAGNOSIS — Y9389 Activity, other specified: Secondary | ICD-10-CM | POA: Insufficient documentation

## 2015-07-21 DIAGNOSIS — Z720 Tobacco use: Secondary | ICD-10-CM | POA: Insufficient documentation

## 2015-07-21 DIAGNOSIS — S1096XA Insect bite of unspecified part of neck, initial encounter: Secondary | ICD-10-CM | POA: Insufficient documentation

## 2015-07-21 DIAGNOSIS — S50861A Insect bite (nonvenomous) of right forearm, initial encounter: Secondary | ICD-10-CM | POA: Diagnosis not present

## 2015-07-21 DIAGNOSIS — Y9289 Other specified places as the place of occurrence of the external cause: Secondary | ICD-10-CM | POA: Insufficient documentation

## 2015-07-21 DIAGNOSIS — S60460A Insect bite (nonvenomous) of right index finger, initial encounter: Secondary | ICD-10-CM | POA: Insufficient documentation

## 2015-07-21 DIAGNOSIS — Y998 Other external cause status: Secondary | ICD-10-CM | POA: Diagnosis not present

## 2015-07-21 DIAGNOSIS — Z8739 Personal history of other diseases of the musculoskeletal system and connective tissue: Secondary | ICD-10-CM | POA: Diagnosis not present

## 2015-07-21 DIAGNOSIS — Z8659 Personal history of other mental and behavioral disorders: Secondary | ICD-10-CM | POA: Diagnosis not present

## 2015-07-21 MED ORDER — DIPHENHYDRAMINE HCL 25 MG PO CAPS
25.0000 mg | ORAL_CAPSULE | Freq: Once | ORAL | Status: AC
  Administered 2015-07-21: 25 mg via ORAL
  Filled 2015-07-21: qty 1

## 2015-07-21 MED ORDER — METHYLPREDNISOLONE SODIUM SUCC 125 MG IJ SOLR
125.0000 mg | Freq: Once | INTRAMUSCULAR | Status: AC
  Administered 2015-07-21: 125 mg via INTRAMUSCULAR
  Filled 2015-07-21: qty 2

## 2015-07-21 NOTE — ED Notes (Signed)
Pt. Eating breakfast

## 2015-07-21 NOTE — ED Notes (Signed)
Wrong documentation [phone] [id number] [phone]

## 2015-07-21 NOTE — Discharge Instructions (Signed)
Continue Benadryl at home as needed for itching. Return here for any new or worsening symptoms.

## 2015-07-21 NOTE — ED Notes (Signed)
Pt. Stated, I thought I was bit by a spider but I have 3 places that look swollen and they itch.

## 2015-07-21 NOTE — ED Provider Notes (Signed)
CSN: [id number]     Arrival date & time 07/21/15  4098 History   First MD Initiated Contact with Patient 07/21/15 0818     Chief Complaint  Patient presents with  . Allergic Reaction  . Insect Bite     (Consider location/radiation/quality/duration/timing/severity/associated sxs/prior Treatment) Patient is a 29 y.o. female presenting with allergic reaction. The history is provided by the patient and medical records.  Allergic Reaction  29 year old female with past medical history significant for anxiety, presenting to the ED for bug bites.  Patient states she thought she was bitten by a spider 3 days ago, but however this morning she noticed 2 other bites. She has noted bites to her right index finger, right forearm, and right side of her neck. There is localized swelling and erythema. She denies any fever or chills. No numbness or weakness.  Patient states she tried cleaning areas with alcohol swabs to "dry them out" however this did not work.  No other intervention tried.  VSS.  Past Medical History  Diagnosis Date  . Medical history non-contributory   . Vaginal Pap smear, abnormal   . Anxiety   . Hx of bursitis     R hip   Past Surgical History  Procedure Laterality Date  . Colposcopy     Family History  Problem Relation Age of Onset  . Alcohol abuse Neg Hx   . Arthritis Neg Hx   . Asthma Neg Hx   . Birth defects Neg Hx   . Cancer Neg Hx   . COPD Neg Hx   . Depression Neg Hx   . Diabetes Neg Hx   . Drug abuse Neg Hx   . Early death Neg Hx   . Hearing loss Neg Hx   . Heart disease Neg Hx   . Hyperlipidemia Neg Hx   . Hypertension Neg Hx   . Kidney disease Neg Hx   . Learning disabilities Neg Hx   . Mental illness Neg Hx   . Mental retardation Neg Hx   . Miscarriages / Stillbirths Neg Hx   . Stroke Neg Hx   . Vision loss Neg Hx   . Varicose Veins Neg Hx    History  Substance Use Topics  . Smoking status: Light Tobacco Smoker -- 0.25 packs/day    Types:  Cigarettes  . Smokeless tobacco: Not on file  . Alcohol Use: Yes   OB History    Gravida Para Term Preterm AB TAB SAB Ectopic Multiple Living   7 4 4  3 1 2   4      Review of Systems  Skin:       Bug bites  All other systems reviewed and are negative.     Allergies  Clindamycin/lincomycin; Metronidazole; and Nickel  Home Medications   Prior to Admission medications   Medication Sig Start Date End Date Taking? Authorizing Provider  ibuprofen (ADVIL,MOTRIN) 600 MG tablet Take 1 tablet (600 mg total) by mouth every 6 (six) hours as needed. 08/04/14   Tracey Harries, MD  oxyCODONE-acetaminophen (PERCOCET/ROXICET) 5-325 MG per tablet Take 1 tablet by mouth every 6 (six) hours as needed for moderate pain. 08/04/14   Tracey Harries, MD  Prenatal Vit-Fe Fumarate-FA (PRENATAL MULTIVITAMIN) TABS tablet Take 1 tablet by mouth daily.    Historical Provider, MD   BP 136/89 mmHg  Pulse 78  Temp(Src) 98.6 F (37 C) (Oral)  Resp 18  SpO2 99%   Physical Exam  Constitutional: She is oriented to  person, place, and time. She appears well-developed and well-nourished.  HENT:  Head: Normocephalic and atraumatic.  Mouth/Throat: Oropharynx is clear and moist.  No oral lesions or swelling; airway clear; handling secretions well, no difficulty swallowing or speaking  Eyes: Conjunctivae and EOM are normal. Pupils are equal, round, and reactive to light.  Neck: Normal range of motion. Neck supple.  Cardiovascular: Normal rate, regular rhythm and normal heart sounds.   Pulmonary/Chest: Effort normal and breath sounds normal. No respiratory distress. She has no wheezes.  Abdominal: Soft. Bowel sounds are normal. There is no tenderness. There is no guarding.  Musculoskeletal: Normal range of motion.  Neurological: She is alert and oriented to person, place, and time.  Skin: Skin is warm and dry. She is not diaphoretic.  Insect bites noted to right index finger, right forearm, and right side of neck;  areas are pruritic with localized erythema and mild swelling; there is no apparent drainage or signs of superimposed infection  Psychiatric: She has a normal mood and affect.  Nursing note and vitals reviewed.   ED Course  Procedures (including critical care time) Labs Review Labs Reviewed - No data to display  Imaging Review No results found.   EKG Interpretation None      MDM   Final diagnoses:  Insect bites   29 year old female with 3 bug bites, one to right index finger, right forearm, right side of neck. There is arthritic. Patient is afebrile, nontoxic. Appears to be localized reaction to insect bite without superimposed infection or cellulitis. There are no oral lesions or airway compromise.  VSS.  Patient treated here with Solu-Medrol and Benadryl, and instructed to continue Benadryl at home.  Discussed plan with patient, he/she acknowledged understanding and agreed with plan of care.  Return precautions given for new or worsening symptoms.  Garlon Hatchet, PA-C 07/21/15 1047  Tilden Fossa, MD 07/21/15 1213

## 2015-08-26 IMAGING — US US FETAL BPP W/O NONSTRESS
1 series · 8 of 8 positions shown · non-contrast
Comparison: none

[Series 1: us fetal bpp w/o nonstress · non-contrast · 8 acquisitions, 8 frames shown]
[im 1/8]
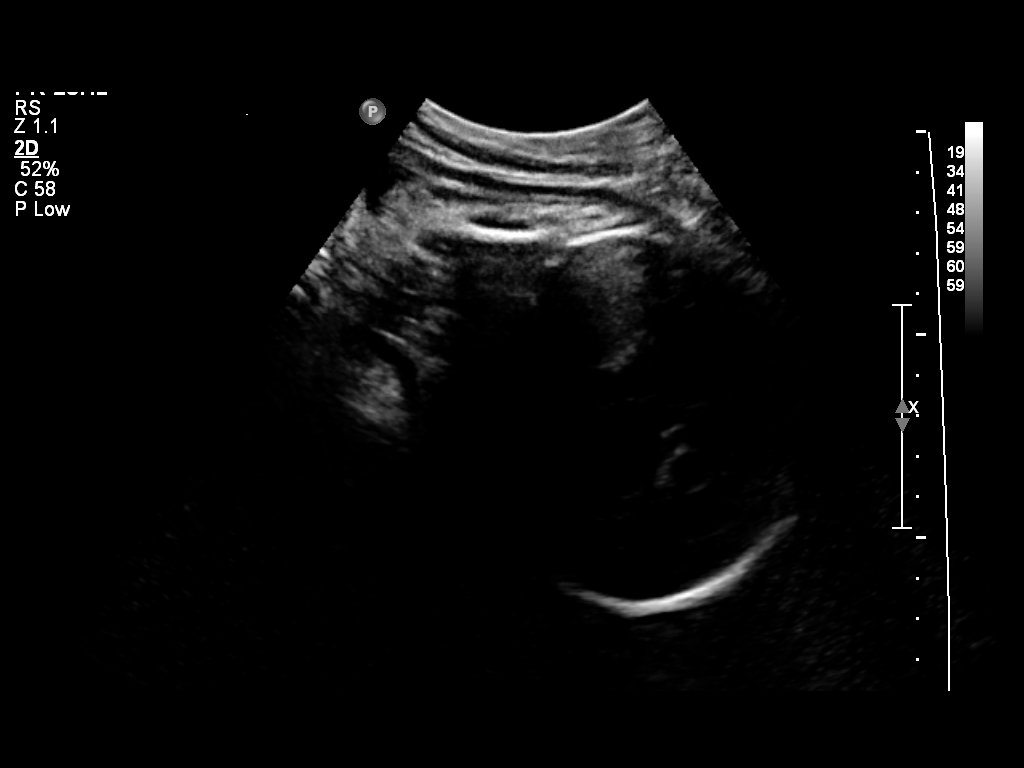
[im 2/8]
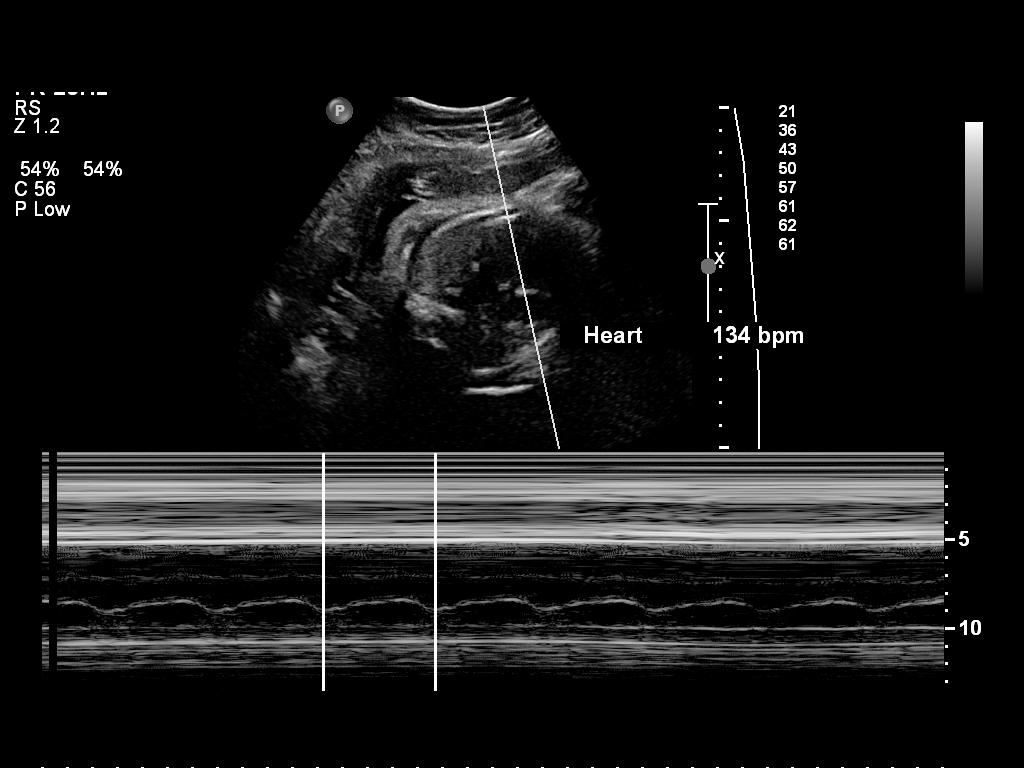
[im 3/8]
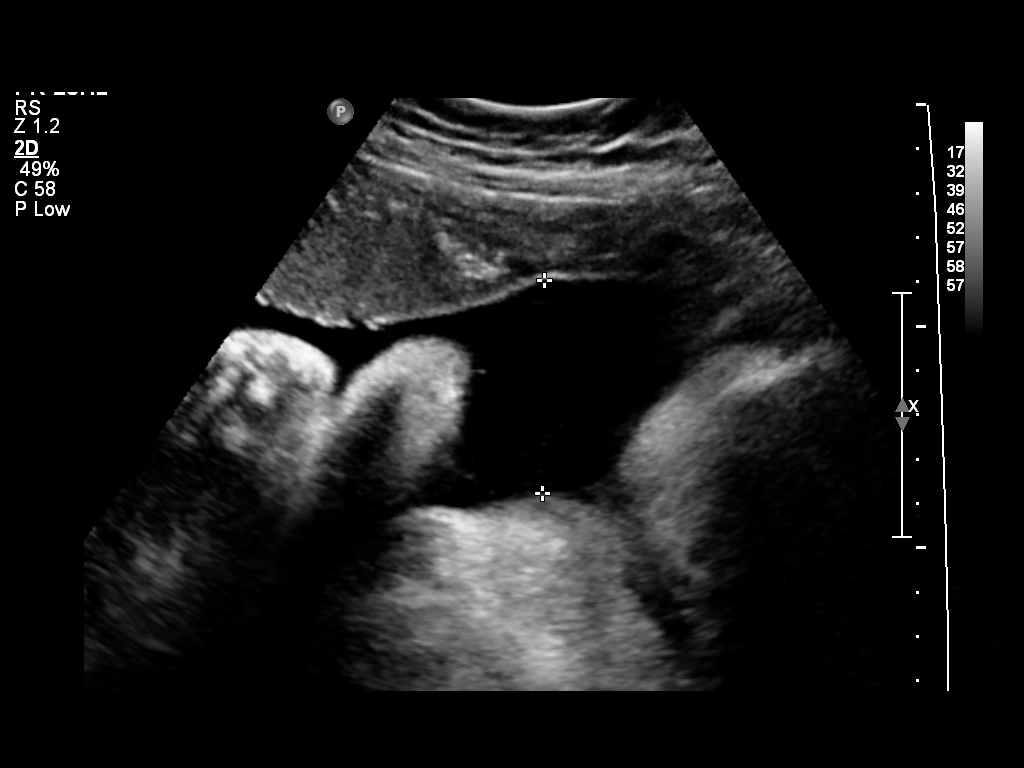
[im 4/8]
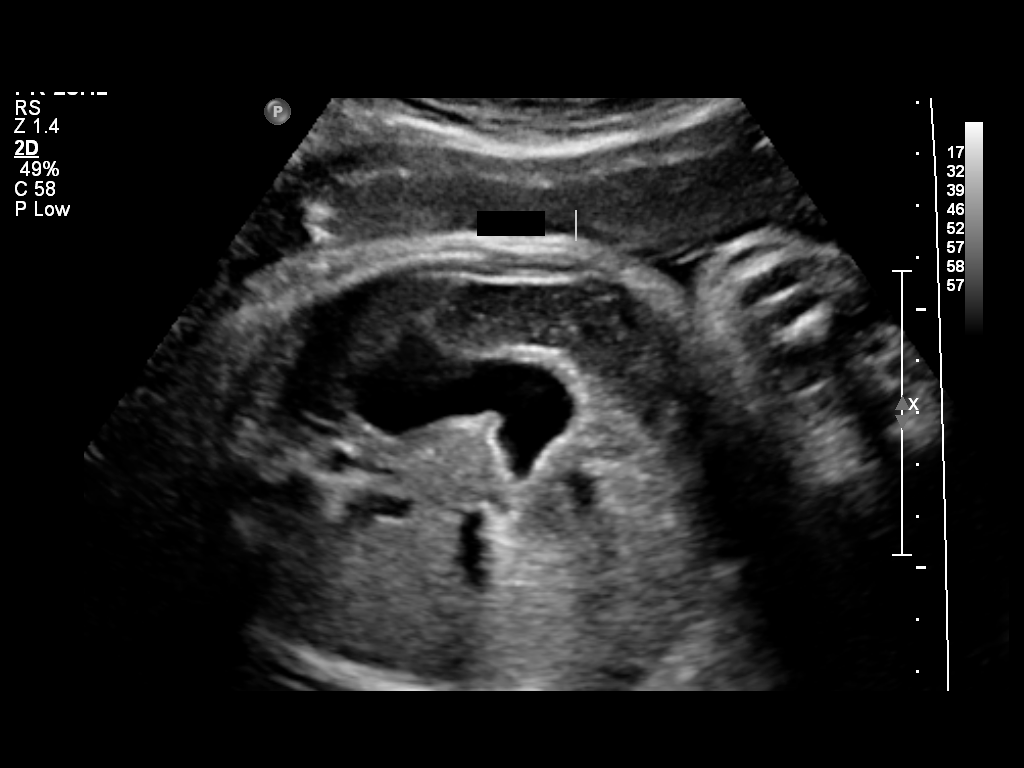
[im 5/8]
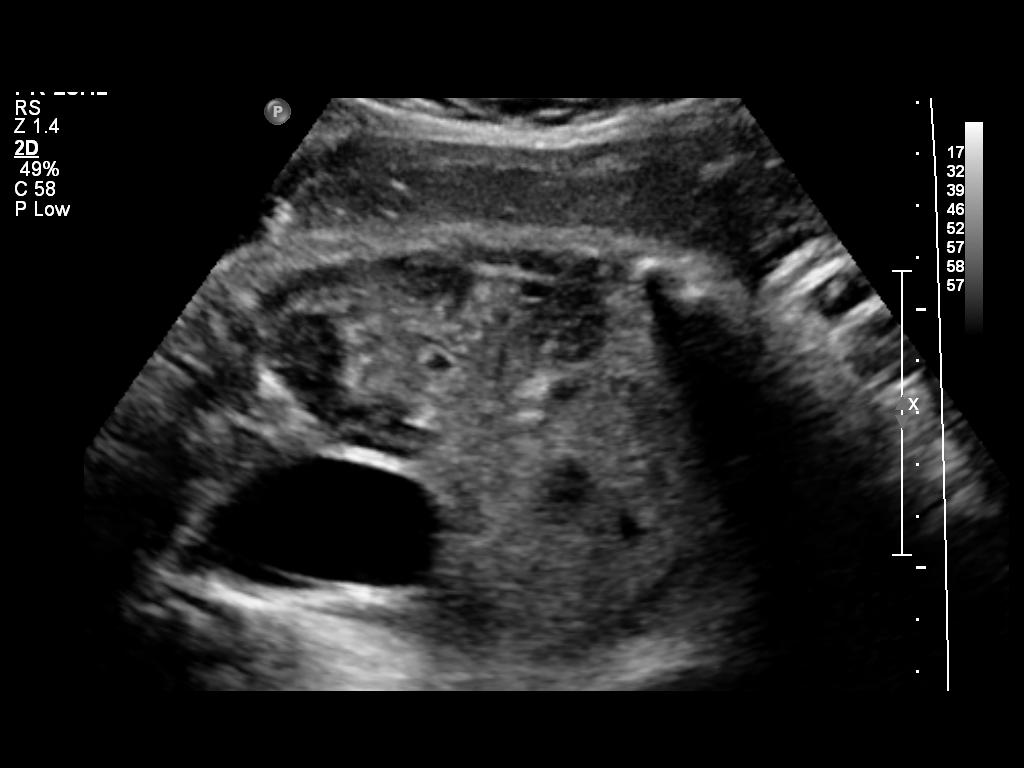
[im 6/8]
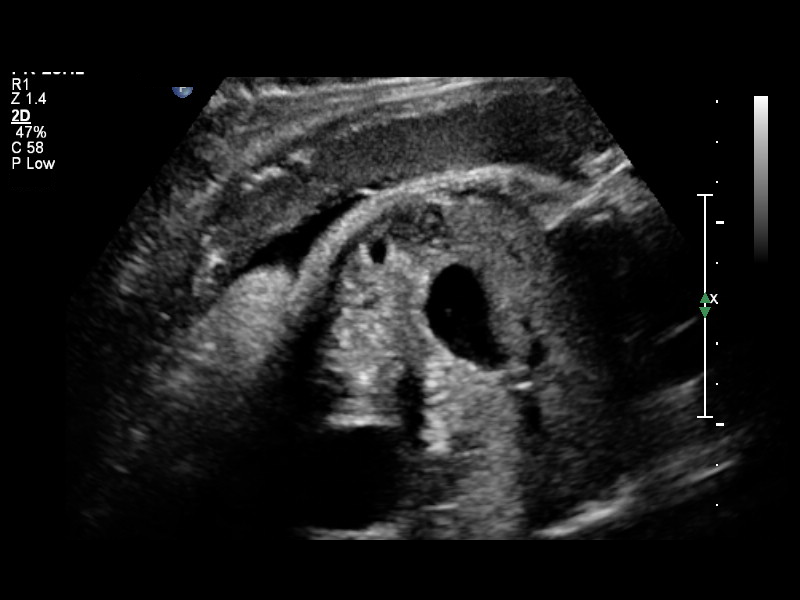
[im 7/8]
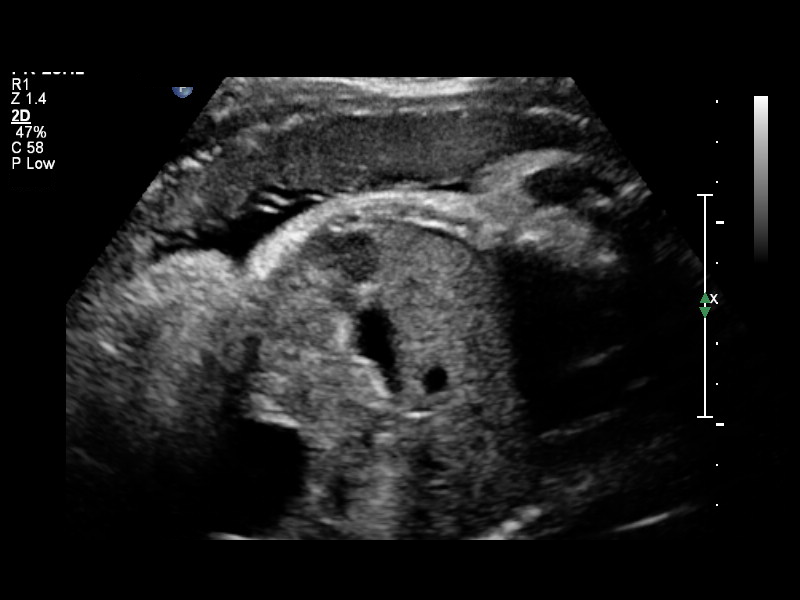
[im 8/8]
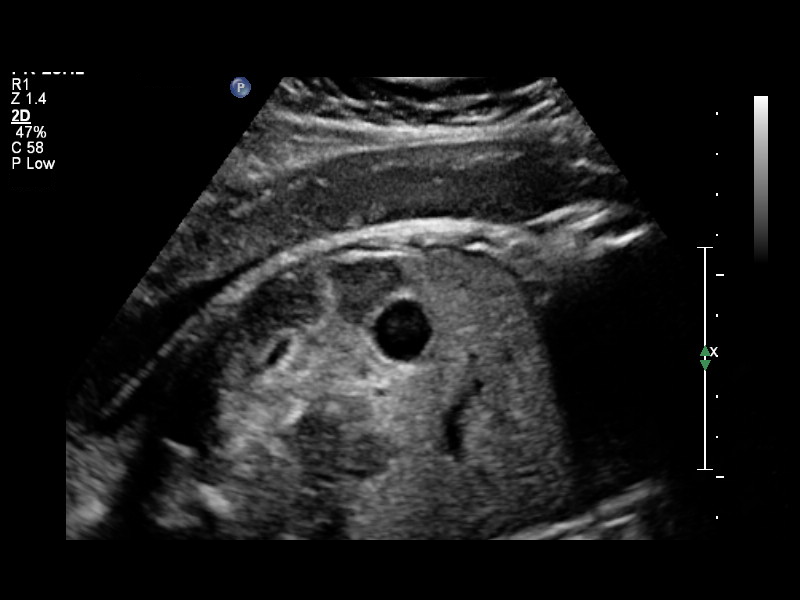

[8 of 8 positions shown; findings below may reference images not displayed]

OBSTETRICS REPORT
                      (Signed Final 08/01/2014 [DATE])

Service(s) Provided

Indications

 Non-reactive NST
Fetal Evaluation

 Num Of Fetuses:    1
 Fetal Heart Rate:  134                          bpm
 Cardiac Activity:  Observed
 Presentation:      Cephalic

 Amniotic Fluid
 AFI FV:      Subjectively within normal limits
                                             Larg Pckt:     4.8  cm
Biophysical Evaluation

 Amniotic F.V:   Within normal limits       F. Tone:        Observed
 F. Movement:    Observed                   Score:          [DATE]
 F. Breathing:   Observed
Gestational Age

 Clinical EDD:  39w 1d                                        EDD:   08/07/14
 Best:          39w 1d     Det. By:  Clinical EDD             EDD:   08/07/14
Anatomy

 Stomach:          Appears normal, left   Bladder:          Appears normal
                   sided
Impression

 SIUP at 39+1 weeks
 Cephalic presentation
 Normal amniotic fluid volume
 BPP [DATE]
Recommendations

 Follow-up as clinically indicated
 questions or concerns.

## 2015-09-22 ENCOUNTER — Encounter (HOSPITAL_COMMUNITY): Payer: Self-pay | Admitting: Emergency Medicine

## 2015-09-22 ENCOUNTER — Emergency Department (HOSPITAL_COMMUNITY)
Admission: EM | Admit: 2015-09-22 | Discharge: 2015-09-22 | Disposition: A | Discharge status: Home / Self Care | Payer: Medicaid Other | Source: Home / Self Care | Attending: Family Medicine | Admitting: Family Medicine

## 2015-09-22 DIAGNOSIS — J029 Acute pharyngitis, unspecified: Secondary | ICD-10-CM | POA: Diagnosis not present

## 2015-09-22 DIAGNOSIS — J069 Acute upper respiratory infection, unspecified: Secondary | ICD-10-CM

## 2015-09-22 LAB — POCT RAPID STREP A: Streptococcus, Group A Screen (Direct): NEGATIVE

## 2015-09-22 MED ORDER — FLUCONAZOLE 150 MG PO TABS: ORAL_TABLET | ORAL | Status: DC

## 2015-09-22 MED ORDER — AMOXICILLIN 500 MG PO CAPS: 1000.0000 mg | ORAL_CAPSULE | Freq: Two times a day (BID) | ORAL | Status: DC

## 2015-09-22 NOTE — ED Provider Notes (Signed)
CSN: [id number]     Arrival date & time 09/22/15  1304 History   First MD Initiated Contact with Patient 09/22/15 1333     Chief Complaint  Patient presents with  . Sore Throat   (Consider location/radiation/quality/duration/timing/severity/associated sxs/prior Treatment) HPI Comments: 29 year old female complains of 2 day history of sore throat, body aches, sniffles, runny nose, PND. Denies cough or shortness of breath.   Past Medical History  Diagnosis Date  . Medical history non-contributory   . Vaginal Pap smear, abnormal   . Anxiety   . Hx of bursitis     R hip   Past Surgical History  Procedure Laterality Date  . Colposcopy     Family History  Problem Relation Age of Onset  . Alcohol abuse Neg Hx   . Arthritis Neg Hx   . Asthma Neg Hx   . Birth defects Neg Hx   . Cancer Neg Hx   . COPD Neg Hx   . Depression Neg Hx   . Diabetes Neg Hx   . Drug abuse Neg Hx   . Early death Neg Hx   . Hearing loss Neg Hx   . Heart disease Neg Hx   . Hyperlipidemia Neg Hx   . Hypertension Neg Hx   . Kidney disease Neg Hx   . Learning disabilities Neg Hx   . Mental illness Neg Hx   . Mental retardation Neg Hx   . Miscarriages / Stillbirths Neg Hx   . Stroke Neg Hx   . Vision loss Neg Hx   . Varicose Veins Neg Hx    Social History  Substance Use Topics  . Smoking status: Light Tobacco Smoker -- 0.25 packs/day    Types: Cigarettes  . Smokeless tobacco: None  . Alcohol Use: Yes   OB History    Gravida Para Term Preterm AB TAB SAB Ectopic Multiple Living   Review of Systems  Constitutional: Negative for fever, chills, activity change, appetite change and fatigue.  HENT: Positive for congestion, postnasal drip, rhinorrhea and sore throat. Negative for facial swelling.   Eyes: Negative.   Respiratory: Negative.  Negative for cough and shortness of breath.   Cardiovascular: Negative.   Musculoskeletal: Negative for neck pain and neck stiffness.  Skin:  Negative for pallor and rash.  Neurological: Negative.     Allergies  Clindamycin/lincomycin; Metronidazole; and Nickel  Home Medications   Prior to Admission medications   Medication Sig Start Date End Date Taking? Authorizing Provider  ibuprofen (ADVIL,MOTRIN) 600 MG tablet Take 1 tablet (600 mg total) by mouth every 6 (six) hours as needed. Patient not taking: Reported on 07/21/2015 08/04/14   Tracey Harries, MD  oxyCODONE-acetaminophen (PERCOCET/ROXICET) 5-325 MG per tablet Take 1 tablet by mouth every 6 (six) hours as needed for moderate pain. Patient not taking: Reported on 07/21/2015 08/04/14   Tracey Harries, MD   Meds Ordered and Administered this Visit  Medications - No data to display  BP 124/84 mmHg  Pulse 94  Temp(Src) 98.4 F (36.9 C) (Oral)  Resp 16  SpO2 98% No data found.   Physical Exam  Constitutional: She is oriented to person, place, and time. She appears well-developed and well-nourished. No distress.  HENT:  Bilateral TMs obstructed by cerumen in the EAC. Oropharynx with minor erythema and few white exudates.  Eyes: Conjunctivae and EOM are normal.  Neck: Normal range of motion. Neck supple.  Palpation of the anterior cervical neck reveals no palpable or observed nodes however it does produce tenderness to the throat.  Cardiovascular: Normal rate, regular rhythm and normal heart sounds.   Pulmonary/Chest: Effort normal and breath sounds normal. No respiratory distress. She has no wheezes. She has no rales.  Musculoskeletal: Normal range of motion. She exhibits no edema.  Neurological: She is alert and oriented to person, place, and time.  Skin: Skin is warm and dry. No rash noted.  Psychiatric: She has a normal mood and affect.  Nursing note and vitals reviewed.   ED Course  Procedures (including critical care time)  Labs Review Labs Reviewed  POCT RAPID STREP A    Imaging Review No results found.  Results for orders placed or performed during  the hospital encounter of 09/22/15  POCT rapid strep A [facility])  Result Value Ref Range   Streptococcus, Group A Screen (Direct) NEGATIVE NEGATIVE    Visual Acuity Review  Right Eye Distance:   Left Eye Distance:   Bilateral Distance:    Right Eye Near:   Left Eye Near:    Bilateral Near:         MDM   1. Exudative pharyngitis   2. URI (upper respiratory infection)    Amoxicillin 1 g twice a day. OTC medication for drainage. Drink plenty of fluids stay well-hydrated Follow with your doctor as needed.    Hayden Rasmussen, NP 09/22/15 [phone]

## 2015-09-22 NOTE — ED Notes (Signed)
Sore throat and body aches since 10/8.  Patient reports sore throat feels like strep. Patient had wisdom teeth x 4 pulled on 9/19.  Patient reports mouth still hurts and has a follow up appt tomorrow-dr brown?

## 2015-09-22 NOTE — Discharge Instructions (Signed)
Pharyngitis °Pharyngitis is redness, pain, and swelling (inflammation) of your pharynx.  °CAUSES  °Pharyngitis is usually caused by infection. Most of the time, these infections are from viruses (viral) and are part of a cold. However, sometimes pharyngitis is caused by bacteria (bacterial). Pharyngitis can also be caused by allergies. Viral pharyngitis may be spread from person to person by coughing, sneezing, and personal items or utensils (cups, forks, spoons, toothbrushes). Bacterial pharyngitis may be spread from person to person by more intimate contact, such as kissing.  °SIGNS AND SYMPTOMS  °Symptoms of pharyngitis include:   °· Sore throat.   °· Tiredness (fatigue).   °· Low-grade fever.   °· Headache. °· Joint pain and muscle aches. °· Skin rashes. °· Swollen lymph nodes. °· Plaque-like film on throat or tonsils (often seen with bacterial pharyngitis). °DIAGNOSIS  °Your health care provider will ask you questions about your illness and your symptoms. Your medical history, along with a physical exam, is often all that is needed to diagnose pharyngitis. Sometimes, a rapid strep test is done. Other lab tests may also be done, depending on the suspected cause.  °TREATMENT  °Viral pharyngitis will usually get better in 3-4 days without the use of medicine. Bacterial pharyngitis is treated with medicines that kill germs (antibiotics).  °HOME CARE INSTRUCTIONS  °· Drink enough water and fluids to keep your urine clear or pale yellow.   °· Only take over-the-counter or prescription medicines as directed by your health care provider:   °· If you are prescribed antibiotics, make sure you finish them even if you start to feel better.   °· Do not take aspirin.   °· Get lots of rest.   °· Gargle with 8 oz of salt water (½ tsp of salt per 1 qt of water) as often as every 1-2 hours to soothe your throat.   °· Throat lozenges (if you are not at risk for choking) or sprays may be used to soothe your throat. °SEEK MEDICAL  CARE IF:  °· You have large, tender lumps in your neck. °· You have a rash. °· You cough up green, yellow-brown, or bloody spit. °SEEK IMMEDIATE MEDICAL CARE IF:  °· Your neck becomes stiff. °· You drool or are unable to swallow liquids. °· You vomit or are unable to keep medicines or liquids down. °· You have severe pain that does not go away with the use of recommended medicines. °· You have trouble breathing (not caused by a stuffy nose). °MAKE SURE YOU:  °· Understand these instructions. °· Will watch your condition. °· Will get help right away if you are not doing well or get worse. °  °This information is not intended to replace advice given to you by your health care provider. Make sure you discuss any questions you have with your health care provider. °  °Document Released: 11/29/2005 Document Revised: 09/19/2013 Document Reviewed: 08/06/2013 °Elsevier Interactive Patient Education ©2016 Elsevier Inc. ° °Viral Infections °A virus is a type of germ. Viruses can cause: °· Minor sore throats. °· Aches and pains. °· Headaches. °· Runny nose. °· Rashes. °· Watery eyes. °· Tiredness. °· Coughs. °· Loss of appetite. °· Feeling sick to your stomach (nausea). °· Throwing up (vomiting). °· Watery poop (diarrhea). °HOME CARE  °· Only take medicines as told by your doctor. °· Drink enough water and fluids to keep your pee (urine) clear or pale yellow. Sports drinks are a good choice. °· Get plenty of rest and eat healthy. Soups and broths with crackers or rice are fine. °GET   HELP RIGHT AWAY IF:   You have a very bad headache.  You have shortness of breath.  You have chest pain or neck pain.  You have an unusual rash.  You cannot stop throwing up.  You have watery poop that does not stop.  You cannot keep fluids down.  You or your child has a temperature by mouth above 102 F (38.9 C), not controlled by medicine.  Your baby is older than 3 months with a rectal temperature of 102 F (38.9 C) or  higher.  Your baby is 593 months old or younger with a rectal temperature of 100.4 F (38 C) or higher. MAKE SURE YOU:   Understand these instructions.  Will watch this condition.  Will get help right away if you are not doing well or get worse.   This information is not intended to replace advice given to you by your health care provider. Make sure you discuss any questions you have with your health care provider.   Document Released: 11/11/2008 Document Revised: 02/21/2012 Document Reviewed: 05/07/2015 Elsevier Interactive Patient Education [employer]2016 Else[employer] Inc[employer]

## 2015-09-24 LAB — CULTURE, GROUP A STREP

## 2015-09-25 NOTE — ED Notes (Signed)
Final report of strep testing positive for strep

## 2015-10-18 ENCOUNTER — Emergency Department (HOSPITAL_COMMUNITY)
Admission: EM | Admit: 2015-10-18 | Discharge: 2015-10-18 | Disposition: A | Discharge status: Home / Self Care | Payer: Medicaid Other | Attending: Emergency Medicine | Admitting: Emergency Medicine

## 2015-10-18 ENCOUNTER — Encounter (HOSPITAL_COMMUNITY): Payer: Self-pay

## 2015-10-18 DIAGNOSIS — R000 Tachycardia, unspecified: Secondary | ICD-10-CM | POA: Diagnosis not present

## 2015-10-18 DIAGNOSIS — Z720 Tobacco use: Secondary | ICD-10-CM | POA: Insufficient documentation

## 2015-10-18 DIAGNOSIS — Z8659 Personal history of other mental and behavioral disorders: Secondary | ICD-10-CM | POA: Diagnosis not present

## 2015-10-18 DIAGNOSIS — N12 Tubulo-interstitial nephritis, not specified as acute or chronic: Secondary | ICD-10-CM | POA: Diagnosis not present

## 2015-10-18 DIAGNOSIS — Z3202 Encounter for pregnancy test, result negative: Secondary | ICD-10-CM | POA: Insufficient documentation

## 2015-10-18 DIAGNOSIS — R300 Dysuria: Secondary | ICD-10-CM | POA: Diagnosis present

## 2015-10-18 LAB — BASIC METABOLIC PANEL
ANION GAP: 12 (ref 5–15)
BUN: 9 mg/dL (ref 6–20)
CHLORIDE: 104 mmol/L (ref 101–111)
CO2: 21 mmol/L — ABNORMAL LOW (ref 22–32)
Calcium: 9.3 mg/dL (ref 8.9–10.3)
Creatinine, Ser: 0.74 mg/dL (ref 0.44–1.00)
GFR calc Af Amer: >60 mL/min (ref >60)
GFR calc non Af Amer: >60 mL/min (ref >60)
Glucose, Bld: 111 mg/dL — ABNORMAL HIGH (ref 65–99)
Potassium: 3.7 mmol/L (ref 3.5–5.1)
Sodium: 137 mmol/L (ref 135–145)

## 2015-10-18 LAB — CBC
HEMATOCRIT: 38.7 % (ref 36.0–46.0)
HEMOGLOBIN: 12.4 g/dL (ref 12.0–15.0)
MCH: 26.6 pg (ref 26.0–34.0)
MCHC: 32 g/dL (ref 30.0–36.0)
MCV: 83 fL (ref 78.0–100.0)
Platelets: 251 10*3/uL (ref 150–400)
RBC: 4.66 MIL/uL (ref 3.87–5.11)
RDW: 14.2 % (ref 11.5–15.5)
WBC: 7.5 10*3/uL (ref 4.0–10.5)

## 2015-10-18 LAB — HEPATIC FUNCTION PANEL
ALK PHOS: 31 U/L — AB (ref 38–126)
ALT: 9 U/L — ABNORMAL LOW (ref 14–54)
AST: 17 U/L (ref 15–41)
Albumin: 3.5 g/dL (ref 3.5–5.0)
BILIRUBIN TOTAL: 0.4 mg/dL (ref 0.3–1.2)
Total Protein: 7.4 g/dL (ref 6.5–8.1)

## 2015-10-18 LAB — URINALYSIS, ROUTINE W REFLEX MICROSCOPIC
Bilirubin Urine: NEGATIVE
GLUCOSE, UA: NEGATIVE mg/dL
HGB URINE DIPSTICK: NEGATIVE
Ketones, ur: NEGATIVE mg/dL
Nitrite: NEGATIVE
PH: 7 (ref 5.0–8.0)
Protein, ur: NEGATIVE mg/dL
Specific Gravity, Urine: 1.009 (ref 1.005–1.030)
Urobilinogen, UA: 0.2 mg/dL (ref 0.0–1.0)

## 2015-10-18 LAB — URINE MICROSCOPIC-ADD ON

## 2015-10-18 LAB — PREGNANCY, URINE: Preg Test, Ur: NEGATIVE

## 2015-10-18 LAB — LIPASE, BLOOD: LIPASE: 29 U/L (ref 11–51)

## 2015-10-18 MED ORDER — SODIUM CHLORIDE 0.9 % IV BOLUS (SEPSIS)
2000.0000 mL | Freq: Once | INTRAVENOUS | Status: AC
  Administered 2015-10-18: 2000 mL via INTRAVENOUS

## 2015-10-18 MED ORDER — CEPHALEXIN 500 MG PO CAPS: 500.0000 mg | ORAL_CAPSULE | Freq: Two times a day (BID) | ORAL | Status: DC

## 2015-10-18 MED ORDER — FLUCONAZOLE 150 MG PO TABS: 150.0000 mg | ORAL_TABLET | Freq: Once | ORAL | Status: DC

## 2015-10-18 MED ORDER — CEPHALEXIN 250 MG PO CAPS
500.0000 mg | ORAL_CAPSULE | Freq: Once | ORAL | Status: AC
  Administered 2015-10-18: 500 mg via ORAL
  Filled 2015-10-18: qty 2

## 2015-10-18 NOTE — ED Notes (Signed)
Pt arrives via EMS with reports of dysuria and frequent urination, onset 3 days ago. She thought she might have a UTI and has been taking OTC meds (AZO) with no relief. Today she was at work and began to feel weak and extremely thirsty so she wanted to come here to be evaluated. BP-172/100, HR-130 with EMS.

## 2015-10-18 NOTE — ED Notes (Signed)
Pt left with all her belongings and ambulated out of treatment area.  

## 2015-10-18 NOTE — Discharge Instructions (Signed)
Pyelonephritis, Adult Ms. April Schaefer, your urine shows an infection.  Take antibiotics as directed and see a primary care doctor within 3 days for close follow-up. If any symptoms worsen come back to emergency department immediately. Thank you. Pyelonephritis is a kidney infection. The kidneys are organs that help clean your blood by moving waste out of your blood and into your pee (urine). This infection can happen quickly, or it can last for a long time. In most cases, it clears up with treatment and does not cause other problems. HOME CARE Medicines  Take over-the-counter and prescription medicines only as told by your doctor.  Take your antibiotic medicine as told by your doctor. Do not stop taking the medicine even if you start to feel better. General Instructions  Drink enough fluid to keep your pee clear or pale yellow.  Avoid caffeine, tea, and carbonated drinks.  Pee (urinate) often. Avoid holding in pee for long periods of time.  Pee before and after sex.  After pooping (having a bowel movement), women should wipe from front to back. Use each tissue only once.  Keep all follow-up visits as told by your doctor. This is important. GET HELP IF:  You do not feel better after 2 days.  Your symptoms get worse.  You have a fever. GET HELP RIGHT AWAY IF:  You cannot take your medicine or drink fluids as told.  You have chills and shaking.  You throw up (vomit).  You have very bad pain in your side (flank) or back.  You feel very weak or you pass out (faint).   This information is not intended to replace advice given to you by your health care provider. Make sure you discuss any questions you have with your health care provider.   Document Released: 01/06/2005 Document Revised: 08/20/2015 Document Reviewed: 03/24/2015 Elsevier Interactive Patient Education [employer] Elsevier Inc.

## 2015-10-18 NOTE — ED Provider Notes (Signed)
CSN: [id number]     Arrival date & time 10/18/15  0031 History  By signing my name below, I, Lyndel Safe, attest that this documentation has been prepared under the direction and in the presence of Tomasita Crumble, MD. Electronically Signed: Lyndel Safe, ED Scribe. 10/18/2015. 1:14 AM.   Chief Complaint  Patient presents with  . Dysuria   The history is provided by the patient. No language interpreter was used.   HPI Comments: April Schaefer is a 29 y.o. female who was brought into the Emergency Department by ambulance complaining of gradually worsening, constant urinary frequency, dysuria, suprapubic abdominal pain, and bilateral flank pain X 4-5 days. She reports associated subjective fevers and chills onset today prompting ED evaluation. She has been taking AZO pills without relief. Pt notes a PMhx of UTIs with the last infection being several years ago with her last pregnancy. Denies abnormal vaginal discharge or bleeding, diarrhea or constipation, vomiting, or nausea.   Past Medical History  Diagnosis Date  . Medical history non-contributory   . Vaginal Pap smear, abnormal   . Anxiety   . Hx of bursitis     R hip   Past Surgical History  Procedure Laterality Date  . Colposcopy     Family History  Problem Relation Age of Onset  . Alcohol abuse Neg Hx   . Arthritis Neg Hx   . Asthma Neg Hx   . Birth defects Neg Hx   . Cancer Neg Hx   . COPD Neg Hx   . Depression Neg Hx   . Diabetes Neg Hx   . Drug abuse Neg Hx   . Early death Neg Hx   . Hearing loss Neg Hx   . Heart disease Neg Hx   . Hyperlipidemia Neg Hx   . Hypertension Neg Hx   . Kidney disease Neg Hx   . Learning disabilities Neg Hx   . Mental illness Neg Hx   . Mental retardation Neg Hx   . Miscarriages / Stillbirths Neg Hx   . Stroke Neg Hx   . Vision loss Neg Hx   . Varicose Veins Neg Hx    Social History  Substance Use Topics  . Smoking status: Light Tobacco Smoker -- 0.25 packs/day    Types:  Cigarettes  . Smokeless tobacco: None  . Alcohol Use: Yes   OB History    Gravida Para Term Preterm AB TAB SAB Ectopic Multiple Living   Review of Systems  Constitutional: Positive for fever and chills.  Gastrointestinal: Positive for abdominal pain. Negative for nausea, vomiting, diarrhea and constipation.  Genitourinary: Positive for dysuria, frequency and flank pain. Negative for vaginal bleeding and vaginal discharge.  A complete 10 system review of systems was obtained and is otherwise negative except at noted in the HPI and PMH.  Allergies  Clindamycin/lincomycin; Metronidazole; and Nickel  Home Medications   Prior to Admission medications   Medication Sig Start Date End Date Taking? Authorizing Provider  amoxicillin (AMOXIL) 500 MG capsule Take 2 capsules (1,000 mg total) by mouth 2 (two) times daily. 09/22/15   Hayden Rasmussen, NP  fluconazole (DIFLUCAN) 150 MG tablet 1 tab po x 1. May repeat in 72 hours if no improvement 09/22/15   Hayden Rasmussen, NP   BP 152/132 mmHg  Pulse 116  Temp(Src) 99 F (37.2 C) (Oral)  Resp 18  Ht  (1.702 m)  Wt  170 lb (77.111 kg)  BMI 26.62 kg/m2  SpO2 98% Physical Exam  Constitutional: She is oriented to person, place, and time. She appears well-developed and well-nourished. No distress.  HENT:  Head: Normocephalic and atraumatic.  Nose: Nose normal.  Mouth/Throat: Oropharynx is clear and moist. No oropharyngeal exudate.  Eyes: Conjunctivae and EOM are normal. Pupils are equal, round, and reactive to light. No scleral icterus.  Neck: Normal range of motion. Neck supple. No JVD present. No tracheal deviation present. No thyromegaly present.  Cardiovascular: Regular rhythm and normal heart sounds.  Exam reveals no gallop and no friction rub.   No murmur heard. Tachycardic.   Pulmonary/Chest: Effort normal and breath sounds normal. No respiratory distress. She has no wheezes. She exhibits no tenderness.  Abdominal:  Soft. Bowel sounds are normal. She exhibits no distension and no mass. There is no tenderness. There is no rebound and no guarding.  Musculoskeletal: Normal range of motion. She exhibits no edema or tenderness.  Lymphadenopathy:    She has no cervical adenopathy.  Neurological: She is alert and oriented to person, place, and time. No cranial nerve deficit. She exhibits normal muscle tone.  Skin: Skin is warm and dry. No rash noted. No erythema. No pallor.  Tactile fever.   Nursing note and vitals reviewed.   ED Course  Procedures  DIAGNOSTIC STUDIES: Oxygen Saturation is 98% on RA, normal by my interpretation.    COORDINATION OF CARE: 1:11 AM Discussed treatment plan with pt at bedside and pt agreed to plan.   Labs Review Labs Reviewed  URINALYSIS, ROUTINE W REFLEX [facility]CROSCOPIC [facility]) - Abnormal; Notable for the following:    APPearance CLOUDY (*)    Leukocytes, UA MODERATE (*)    All other components within normal limits  BASIC METABOLIC PANEL - Abnormal; Notable for the following:    CO2 21 (*)    Glucose, Bld 111 (*)    All other components within normal limits  HEPATIC FUNCTION PANEL - Abnormal; Notable for the following:    ALT 9 (*)    Alkaline Phosphatase 31 (*)    Bilirubin, Direct <0.1 (*)    All other components within normal limits  URINE MICROSCOPIC-ADD ON - Abnormal; Notable for the following:    Bacteria, UA FEW (*)    All other components within normal limits  CBC  LIPASE, BLOOD  PREGNANCY, URINE   Imaging Review No results found. I have personally reviewed and evaluated these images and lab results as part of my medical decision-making.   MDM   Final diagnoses:  None   Patient presents emergency department for concern for UTI. She says is dysuria and increased frequency. Urinalysis does reveal bacteria, leukocytes as well.  In the setting of her being symptomatic, will treat with Keflex. Patient given IV fluids for tachycardia. She otherwise  appears well in no acute distress.  HR is now in the 80s.  Will DC with keflex Rx and PCP fu.  VS remain within her normal limits and she is safe for DC.   I personally performed the services described in this documentation, which was scribed in my presence. The recorded information has been reviewed and is accurate.      Tomasita CrumbleAdeleke Stasia Somero, MD 10/18/15 [phone]

## 2015-12-03 ENCOUNTER — Encounter (HOSPITAL_COMMUNITY): Payer: Self-pay | Admitting: *Deleted

## 2015-12-03 ENCOUNTER — Emergency Department (HOSPITAL_COMMUNITY)
Admission: EM | Admit: 2015-12-03 | Discharge: 2015-12-03 | Disposition: A | Discharge status: Home / Self Care | Payer: Medicaid Other | Source: Home / Self Care

## 2015-12-03 ENCOUNTER — Other Ambulatory Visit (HOSPITAL_COMMUNITY)
Admission: RE | Admit: 2015-12-03 | Discharge: 2015-12-03 | Disposition: A | Discharge status: Home / Self Care | Payer: Medicaid Other | Source: Ambulatory Visit | Attending: Family Medicine | Admitting: Family Medicine

## 2015-12-03 DIAGNOSIS — N3001 Acute cystitis with hematuria: Secondary | ICD-10-CM

## 2015-12-03 HISTORY — DX: Urinary tract infection, site not specified: N39.0

## 2015-12-03 LAB — POCT URINALYSIS DIP (DEVICE)
Bilirubin Urine: NEGATIVE
GLUCOSE, UA: NEGATIVE mg/dL
Ketones, ur: NEGATIVE mg/dL
LEUKOCYTES UA: NEGATIVE
Nitrite: NEGATIVE
Protein, ur: NEGATIVE mg/dL
Specific Gravity, Urine: >=1.03 (ref 1.005–1.030)
UROBILINOGEN UA: 0.2 mg/dL (ref 0.0–1.0)
pH: 6 (ref 5.0–8.0)

## 2015-12-03 LAB — POCT PREGNANCY, URINE: Preg Test, Ur: NEGATIVE

## 2015-12-03 MED ORDER — FLUCONAZOLE 150 MG PO TABS: 150.0000 mg | ORAL_TABLET | Freq: Once | ORAL | Status: DC

## 2015-12-03 MED ORDER — TERCONAZOLE 80 MG VA SUPP: 80.0000 mg | Freq: Every day | VAGINAL | Status: DC

## 2015-12-03 MED ORDER — CIPROFLOXACIN HCL 500 MG PO TABS: 500.0000 mg | ORAL_TABLET | Freq: Two times a day (BID) | ORAL | Status: DC

## 2015-12-03 NOTE — ED Provider Notes (Signed)
CSN: [id number]     Arrival date & time 12/03/15  1453 History   None    Chief Complaint  Patient presents with  . Urinary Tract Infection   (Consider location/radiation/quality/duration/timing/severity/associated sxs/prior Treatment) Patient is a 29 y.o. female presenting with urinary tract infection. The history is provided by the patient.  Urinary Tract Infection Pain quality:  Burning Pain severity:  Mild Onset quality:  Gradual Duration:  4 days Progression:  Unchanged Chronicity:  Recurrent Recent urinary tract infections: yes   Relieved by:  None tried Worsened by:  Nothing tried Ineffective treatments:  None tried Urinary symptoms: frequent urination   Associated symptoms: vaginal discharge   Associated symptoms: no fever and no nausea   Risk factors: recurrent urinary tract infections     Past Medical History  Diagnosis Date  . Medical history non-contributory   . Vaginal Pap smear, abnormal   . Anxiety   . Hx of bursitis     R hip  . UTI (urinary tract infection)    Past Surgical History  Procedure Laterality Date  . Colposcopy     Family History  Problem Relation Age of Onset  . Alcohol abuse Neg Hx   . Arthritis Neg Hx   . Asthma Neg Hx   . Birth defects Neg Hx   . Cancer Neg Hx   . COPD Neg Hx   . Depression Neg Hx   . Diabetes Neg Hx   . Drug abuse Neg Hx   . Early death Neg Hx   . Hearing loss Neg Hx   . Heart disease Neg Hx   . Hyperlipidemia Neg Hx   . Hypertension Neg Hx   . Kidney disease Neg Hx   . Learning disabilities Neg Hx   . Mental illness Neg Hx   . Mental retardation Neg Hx   . Miscarriages / Stillbirths Neg Hx   . Stroke Neg Hx   . Vision loss Neg Hx   . Varicose Veins Neg Hx    Social History  Substance Use Topics  . Smoking status: Light Tobacco Smoker -- 0.25 packs/day    Types: Cigarettes  . Smokeless tobacco: None  . Alcohol Use: Yes   OB History    Gravida Para Term Preterm AB TAB SAB Ectopic Multiple Living    7 4 4  3 1 2   4      Review of Systems  Constitutional: Negative.  Negative for fever.  Gastrointestinal: Negative for nausea.  Genitourinary: Positive for dysuria, urgency, frequency and vaginal discharge. Negative for vaginal pain and pelvic pain.       Feel like yeast inf from prev abx.  All other systems reviewed and are negative.   Allergies  Clindamycin/lincomycin; Metronidazole; and Nickel  Home Medications   Prior to Admission medications   Medication Sig Start Date End Date Taking? Authorizing Provider  cephALEXin (KEFLEX) 500 MG capsule Take 1 capsule (500 mg total) by mouth 2 (two) times daily. 10/18/15   Tomasita CrumbleAdeleke Oni, MD  ciprofloxacin (CIPRO) 500 MG tablet Take 1 tablet (500 mg total) by mouth 2 (two) times daily. 12/03/15   Linna HoffJames D Rameses Ou, MD  CRANBERRY PO Take 1 tablet by mouth as needed (for burning).    Historical Provider, MD  fluconazole (DIFLUCAN) 150 MG tablet Take 1 tablet (150 mg total) by mouth once. 10/18/15   Tomasita CrumbleAdeleke Oni, MD  terconazole (TERAZOL 3) 80 MG vaginal suppository Place 1 suppository (80 mg total) vaginally at bedtime. 12/03/15  Linna Hoff, MD   Meds Ordered and Administered this Visit  Medications - No data to display  BP 139/84 mmHg  Pulse 92  Temp(Src) 98.7 F (37.1 C) (Oral)  Resp 16  SpO2 98%  LMP 12/02/2015 No data found.   Physical Exam  Constitutional: She is oriented to person, place, and time. She appears well-developed and well-nourished.  Abdominal: Soft. Normal appearance and bowel sounds are normal. She exhibits no distension and no mass. There is no hepatosplenomegaly. There is tenderness in the suprapubic area. There is no rigidity, no rebound, no guarding and no CVA tenderness.  Neurological: She is alert and oriented to person, place, and time.  Skin: Skin is warm and dry.  Nursing note and vitals reviewed.   ED Course  Procedures (including critical care time)  Labs Review Labs Reviewed  POCT URINALYSIS DIP  (DEVICE) - Abnormal; Notable for the following:    Hgb urine dipstick LARGE (*)    All other components within normal limits  URINE CULTURE  POCT PREGNANCY, URINE    Imaging Review No results found.   Visual Acuity Review  Right Eye Distance:   Left Eye Distance:   Bilateral Distance:    Right Eye Near:   Left Eye Near:    Bilateral Near:         MDM   1. Acute cystitis with hematuria        Linna Hoff, MD 12/03/15 [phone]

## 2015-12-03 NOTE — ED Notes (Signed)
Pt  Reports  Symptoms  Of    uti    To  Frequency        And  Urinary  Discomfort  For  About  4  Days

## 2015-12-05 LAB — URINE CULTURE: Special Requests: NORMAL

## 2016-02-17 ENCOUNTER — Emergency Department (INDEPENDENT_AMBULATORY_CARE_PROVIDER_SITE_OTHER)
Admission: EM | Admit: 2016-02-17 | Discharge: 2016-02-17 | Disposition: A | Discharge status: Home / Self Care | Payer: Medicaid Other | Source: Home / Self Care | Attending: Family Medicine | Admitting: Family Medicine

## 2016-02-17 ENCOUNTER — Encounter (HOSPITAL_COMMUNITY): Payer: Self-pay | Admitting: *Deleted

## 2016-02-17 DIAGNOSIS — N390 Urinary tract infection, site not specified: Secondary | ICD-10-CM | POA: Diagnosis not present

## 2016-02-17 LAB — POCT URINALYSIS DIP (DEVICE)
BILIRUBIN URINE: NEGATIVE
Glucose, UA: NEGATIVE mg/dL
KETONES UR: NEGATIVE mg/dL
NITRITE: POSITIVE — AB
Protein, ur: 100 mg/dL — AB
Urobilinogen, UA: 1 mg/dL (ref 0.0–1.0)
pH: 6.5 (ref 5.0–8.0)

## 2016-02-17 LAB — POCT PREGNANCY, URINE: Preg Test, Ur: NEGATIVE

## 2016-02-17 MED ORDER — CEPHALEXIN 500 MG PO CAPS: 500.0000 mg | ORAL_CAPSULE | Freq: Four times a day (QID) | ORAL | Status: DC

## 2016-02-17 MED ORDER — FLUCONAZOLE 150 MG PO TABS: 150.0000 mg | ORAL_TABLET | Freq: Once | ORAL | Status: DC

## 2016-02-17 NOTE — ED Provider Notes (Signed)
CSN: [id number]     Arrival date & time 02/17/16  1302 History   None    Chief Complaint  Patient presents with  . Urinary Tract Infection   (Consider location/radiation/quality/duration/timing/severity/associated sxs/prior Treatment) Patient is a 30 y.o. female presenting with urinary tract infection. The history is provided by the patient.  Urinary Tract Infection Pain quality:  Burning Pain severity:  Mild Onset quality:  Gradual Duration:  3 days Progression:  Worsening Chronicity:  New Recent urinary tract infections: no   Relieved by:  None tried Worsened by:  Nothing tried Ineffective treatments:  None tried Urinary symptoms: frequent urination   Associated symptoms: no abdominal pain, no fever, no flank pain and no vaginal discharge   Risk factors: recurrent urinary tract infections     Past Medical History  Diagnosis Date  . Medical history non-contributory   . Vaginal Pap smear, abnormal   . Anxiety   . Hx of bursitis     R hip  . UTI (urinary tract infection)    Past Surgical History  Procedure Laterality Date  . Colposcopy     Family History  Problem Relation Age of Onset  . Alcohol abuse Neg Hx   . Arthritis Neg Hx   . Asthma Neg Hx   . Birth defects Neg Hx   . Cancer Neg Hx   . COPD Neg Hx   . Depression Neg Hx   . Diabetes Neg Hx   . Drug abuse Neg Hx   . Early death Neg Hx   . Hearing loss Neg Hx   . Heart disease Neg Hx   . Hyperlipidemia Neg Hx   . Hypertension Neg Hx   . Kidney disease Neg Hx   . Learning disabilities Neg Hx   . Mental illness Neg Hx   . Mental retardation Neg Hx   . Miscarriages / Stillbirths Neg Hx   . Stroke Neg Hx   . Vision loss Neg Hx   . Varicose Veins Neg Hx    Social History  Substance Use Topics  . Smoking status: Light Tobacco Smoker -- 0.25 packs/day    Types: Cigarettes  . Smokeless tobacco: None  . Alcohol Use: Yes   OB History    Gravida Para Term Preterm AB TAB SAB Ectopic Multiple Living   7 4  4  3 1 2   4      Review of Systems  Constitutional: Negative for fever.  Cardiovascular: Negative.   Gastrointestinal: Negative.  Negative for abdominal pain.  Genitourinary: Positive for dysuria, urgency, frequency and hematuria. Negative for flank pain, vaginal discharge, difficulty urinating and dyspareunia.  All other systems reviewed and are negative.   Allergies  Clindamycin/lincomycin; Metronidazole; and Nickel  Home Medications   Prior to Admission medications   Medication Sig Start Date End Date Taking? Authorizing Provider  cephALEXin (KEFLEX) 500 MG capsule Take 1 capsule (500 mg total) by mouth 4 (four) times daily. Take all of medicine and drink lots of fluids 02/17/16   Linna HoffJames D Summerlyn Fickel, MD  ciprofloxacin (CIPRO) 500 MG tablet Take 1 tablet (500 mg total) by mouth 2 (two) times daily. 12/03/15   Linna HoffJames D Dotty Gonzalo, MD  CRANBERRY PO Take 1 tablet by mouth as needed (for burning).    Historical Provider, MD  fluconazole (DIFLUCAN) 150 MG tablet Take 1 tablet (150 mg total) by mouth once. Repeat in 1 week if needed. 02/17/16   Linna HoffJames D Maymunah Stegemann, MD  terconazole (TERAZOL 3) 80 MG vaginal  suppository Place 1 suppository (80 mg total) vaginally at bedtime. 12/03/15   Linna Hoff, MD   Meds Ordered and Administered this Visit  Medications - No data to display  BP 123/86 mmHg  Pulse 88  Temp(Src) 98.2 F (36.8 C)  Resp 16  SpO2 100%  LMP 02/02/2016 No data found.   Physical Exam  Constitutional: She is oriented to person, place, and time. She appears well-developed and well-nourished.  Abdominal: Soft. Bowel sounds are normal. She exhibits no distension and no mass. There is tenderness in the suprapubic area. There is no rebound, no guarding and no CVA tenderness.    Neurological: She is alert and oriented to person, place, and time.  Skin: Skin is warm and dry.  Nursing note and vitals reviewed.   ED Course  Procedures (including critical care time)  Labs Review Labs  Reviewed  POCT URINALYSIS DIP (DEVICE) - Abnormal; Notable for the following:    Hgb urine dipstick MODERATE (*)    Protein, ur 100 (*)    Nitrite POSITIVE (*)    Leukocytes, UA SMALL (*)    All other components within normal limits  POCT PREGNANCY, URINE    Imaging Review No results found.   Visual Acuity Review  Right Eye Distance:   Left Eye Distance:   Bilateral Distance:    Right Eye Near:   Left Eye Near:    Bilateral Near:         MDM   1. UTI (lower urinary tract infection)        Linna Hoff, MD 02/17/16 1406

## 2016-02-17 NOTE — ED Notes (Signed)
Pt  Reports  A   Slight  Discharge   For  1  Week    She   Also reports       burning  And  Frequency  Of  Urination    Since  yest      She  Ambulated  With a  Steady  Fluid  Gait

## 2016-02-17 NOTE — Discharge Instructions (Signed)
Take all of medicine as directed, drink lots of fluids, see your doctor if further problems. °

## 2016-03-03 ENCOUNTER — Encounter (HOSPITAL_COMMUNITY): Payer: Self-pay | Admitting: Emergency Medicine

## 2016-03-03 ENCOUNTER — Emergency Department (HOSPITAL_COMMUNITY)
Admission: EM | Admit: 2016-03-03 | Discharge: 2016-03-03 | Disposition: A | Discharge status: Home / Self Care | Payer: Medicaid Other | Source: Home / Self Care | Attending: Family Medicine | Admitting: Family Medicine

## 2016-03-03 ENCOUNTER — Emergency Department (HOSPITAL_COMMUNITY)
Admission: EM | Admit: 2016-03-03 | Discharge: 2016-03-03 | Disposition: A | Discharge status: Home / Self Care | Payer: Medicaid Other | Attending: Emergency Medicine | Admitting: Emergency Medicine

## 2016-03-03 DIAGNOSIS — F17210 Nicotine dependence, cigarettes, uncomplicated: Secondary | ICD-10-CM | POA: Diagnosis not present

## 2016-03-03 DIAGNOSIS — Z3202 Encounter for pregnancy test, result negative: Secondary | ICD-10-CM | POA: Diagnosis not present

## 2016-03-03 DIAGNOSIS — N390 Urinary tract infection, site not specified: Secondary | ICD-10-CM

## 2016-03-03 DIAGNOSIS — R300 Dysuria: Secondary | ICD-10-CM | POA: Insufficient documentation

## 2016-03-03 DIAGNOSIS — R1030 Lower abdominal pain, unspecified: Secondary | ICD-10-CM | POA: Insufficient documentation

## 2016-03-03 LAB — COMPREHENSIVE METABOLIC PANEL
ALK PHOS: 31 U/L — AB (ref 38–126)
ALT: 11 U/L — ABNORMAL LOW (ref 14–54)
ANION GAP: 4 — AB (ref 5–15)
AST: 15 U/L (ref 15–41)
Albumin: 3.2 g/dL — ABNORMAL LOW (ref 3.5–5.0)
BILIRUBIN TOTAL: 0.2 mg/dL — AB (ref 0.3–1.2)
BUN: 13 mg/dL (ref 6–20)
CALCIUM: 8.9 mg/dL (ref 8.9–10.3)
CO2: 25 mmol/L (ref 22–32)
Chloride: 107 mmol/L (ref 101–111)
Creatinine, Ser: 0.8 mg/dL (ref 0.44–1.00)
GFR calc non Af Amer: >60 mL/min (ref >60)
GLUCOSE: 90 mg/dL (ref 65–99)
Potassium: 3.7 mmol/L (ref 3.5–5.1)
Sodium: 136 mmol/L (ref 135–145)
TOTAL PROTEIN: 6.1 g/dL — AB (ref 6.5–8.1)

## 2016-03-03 LAB — POCT URINALYSIS DIP (DEVICE)
BILIRUBIN URINE: NEGATIVE
Glucose, UA: NEGATIVE mg/dL
Ketones, ur: NEGATIVE mg/dL
NITRITE: NEGATIVE
Protein, ur: NEGATIVE mg/dL
SPECIFIC GRAVITY, URINE: 1.02 (ref 1.005–1.030)
UROBILINOGEN UA: 0.2 mg/dL (ref 0.0–1.0)
pH: 7 (ref 5.0–8.0)

## 2016-03-03 LAB — URINE MICROSCOPIC-ADD ON

## 2016-03-03 LAB — URINALYSIS, ROUTINE W REFLEX MICROSCOPIC
BILIRUBIN URINE: NEGATIVE
Glucose, UA: NEGATIVE mg/dL
Ketones, ur: NEGATIVE mg/dL
NITRITE: NEGATIVE
PROTEIN: 100 mg/dL — AB
SPECIFIC GRAVITY, URINE: 1.025 (ref 1.005–1.030)
pH: 7 (ref 5.0–8.0)

## 2016-03-03 LAB — CBC
HCT: 38.2 % (ref 36.0–46.0)
HEMOGLOBIN: 11.8 g/dL — AB (ref 12.0–15.0)
MCH: 26 pg (ref 26.0–34.0)
MCHC: 30.9 g/dL (ref 30.0–36.0)
MCV: 84.3 fL (ref 78.0–100.0)
Platelets: 190 10*3/uL (ref 150–400)
RBC: 4.53 MIL/uL (ref 3.87–5.11)
RDW: 14.6 % (ref 11.5–15.5)
WBC: 7.3 10*3/uL (ref 4.0–10.5)

## 2016-03-03 LAB — LIPASE, BLOOD: Lipase: 88 U/L — ABNORMAL HIGH (ref 11–51)

## 2016-03-03 LAB — POC URINE PREG, ED: PREG TEST UR: NEGATIVE

## 2016-03-03 MED ORDER — SULFAMETHOXAZOLE-TRIMETHOPRIM 800-160 MG PO TABS: 1.0000 | ORAL_TABLET | Freq: Two times a day (BID) | ORAL | Status: AC

## 2016-03-03 MED ORDER — FLUCONAZOLE 150 MG PO TABS: ORAL_TABLET | ORAL | Status: DC

## 2016-03-03 NOTE — ED Provider Notes (Signed)
CSN: [id number]     Arrival date & time 03/03/16  1301 History   First MD Initiated Contact with Patient 03/03/16 1313     Chief Complaint  Patient presents with  . Urinary Frequency   (Consider location/radiation/quality/duration/timing/severity/associated sxs/prior Treatment) HPI Comments: 30 year old female states she developed UTI or bladder infection symptoms yesterday. She is complaining of urinary frequency, dysuria and cloudy urine. Denies fever, chills, nausea or vomiting. She states that on March 7 she was in the urgent care with similar symptoms and treated with Keflex. Symptoms abated however after completing the antibiotic the symptoms returned. No culture was obtained at that time. LMP ended approximately 5 days ago. It was right on time and none missed    Past Medical History  Diagnosis Date  . Medical history non-contributory   . Vaginal Pap smear, abnormal   . Anxiety   . Hx of bursitis     R hip  . UTI (urinary tract infection)    Past Surgical History  Procedure Laterality Date  . Colposcopy     Family History  Problem Relation Age of Onset  . Alcohol abuse Neg Hx   . Arthritis Neg Hx   . Asthma Neg Hx   . Birth defects Neg Hx   . Cancer Neg Hx   . COPD Neg Hx   . Depression Neg Hx   . Diabetes Neg Hx   . Drug abuse Neg Hx   . Early death Neg Hx   . Hearing loss Neg Hx   . Heart disease Neg Hx   . Hyperlipidemia Neg Hx   . Hypertension Neg Hx   . Kidney disease Neg Hx   . Learning disabilities Neg Hx   . Mental illness Neg Hx   . Mental retardation Neg Hx   . Miscarriages / Stillbirths Neg Hx   . Stroke Neg Hx   . Vision loss Neg Hx   . Varicose Veins Neg Hx    Social History  Substance Use Topics  . Smoking status: Light Tobacco Smoker -- 0.25 packs/day    Types: Cigarettes  . Smokeless tobacco: None  . Alcohol Use: Yes   OB History    Gravida Para Term Preterm AB TAB SAB Ectopic Multiple Living   7 4 4  3 1 2   4      Review of Systems   Constitutional: Negative.  Negative for fever.  HENT: Negative.   Respiratory: Negative.   Cardiovascular: Negative.   Genitourinary: Positive for dysuria, urgency and frequency. Negative for flank pain, vaginal bleeding, vaginal discharge and pelvic pain.  Skin: Negative.   Neurological: Negative.   Psychiatric/Behavioral: Negative.     Allergies  Clindamycin/lincomycin; Metronidazole; and Nickel  Home Medications   Prior to Admission medications   Medication Sig Start Date End Date Taking? Authorizing Provider  cephALEXin (KEFLEX) 500 MG capsule Take 1 capsule (500 mg total) by mouth 4 (four) times daily. Take all of medicine and drink lots of fluids 02/17/16   Linna HoffJames D Kindl, MD  ciprofloxacin (CIPRO) 500 MG tablet Take 1 tablet (500 mg total) by mouth 2 (two) times daily. 12/03/15   Linna HoffJames D Kindl, MD  CRANBERRY PO Take 1 tablet by mouth as needed (for burning).    Historical Provider, MD  fluconazole (DIFLUCAN) 150 MG tablet 1 tab po x 1. May repeat in 72 hours if no improvement 03/03/16   Hayden Rasmussenavid Nakesha Ebrahim, NP  sulfamethoxazole-trimethoprim (BACTRIM DS,SEPTRA DS) 800-160 MG tablet Take 1 tablet by  mouth 2 (two) times daily. 03/03/16 03/10/16  Hayden Rasmussen, NP  terconazole (TERAZOL 3) 80 MG vaginal suppository Place 1 suppository (80 mg total) vaginally at bedtime. 12/03/15   Linna Hoff, MD   Meds Ordered and Administered this Visit  Medications - No data to display  BP 139/100 mmHg  Pulse 84  Temp(Src) 97.8 F (36.6 C) (Oral)  Resp 18  SpO2 98%  LMP 02/26/2016 (Approximate) No data found.   Physical Exam  Constitutional: She is oriented to person, place, and time. She appears well-developed and well-nourished. No distress.  Eyes: EOM are normal.  Neck: Normal range of motion. Neck supple.  Cardiovascular: Normal rate.   Pulmonary/Chest: Effort normal. No respiratory distress.  Musculoskeletal: She exhibits no edema.  Neurological: She is alert and oriented to person, place, and  time. She exhibits normal muscle tone.  Skin: Skin is warm and dry.  Psychiatric: She has a normal mood and affect.  Nursing note and vitals reviewed.   ED Course  Procedures (including critical care time)  Labs Review Labs Reviewed  POCT URINALYSIS DIP (DEVICE) - Abnormal; Notable for the following:    Hgb urine dipstick TRACE (*)    Leukocytes, UA MODERATE (*)    All other components within normal limits  URINE CULTURE   Results for orders placed or performed during the hospital encounter of 03/03/16  POCT urinalysis dip (device)  Result Value Ref Range   Glucose, UA NEGATIVE NEGATIVE mg/dL   Bilirubin Urine NEGATIVE NEGATIVE   Ketones, ur NEGATIVE NEGATIVE mg/dL   Specific Gravity, Urine 1.020 1.005 - 1.030   Hgb urine dipstick TRACE (A) NEGATIVE   pH 7.0 5.0 - 8.0   Protein, ur NEGATIVE NEGATIVE mg/dL   Urobilinogen, UA 0.2 0.0 - 1.0 mg/dL   Nitrite NEGATIVE NEGATIVE   Leukocytes, UA MODERATE (A) NEGATIVE   This urine collected in the ED this AM. Pt left without completing the exam and tx. Imaging Review No results found.   Visual Acuity Review  Right Eye Distance:   Left Eye Distance:   Bilateral Distance:    Right Eye Near:   Left Eye Near:    Bilateral Near:         MDM   1. UTI (lower urinary tract infection)    Meds ordered this encounter  Medications  . sulfamethoxazole-trimethoprim (BACTRIM DS,SEPTRA DS) 800-160 MG tablet    Sig: Take 1 tablet by mouth 2 (two) times daily.    Dispense:  14 tablet    Refill:  0    Order Specific Question:  Supervising Provider    Answer:  Charm Rings [id number]  . fluconazole (DIFLUCAN) 150 MG tablet    Sig: 1 tab po x 1. May repeat in 72 hours if no improvement    Dispense:  2 tablet    Refill:  0    Order Specific Question:  Supervising Provider    Answer:  Charm Rings [[id number]]   Lots of fluids Culture pending     Hayden Rasmussen, NP 03/03/16 1335

## 2016-03-03 NOTE — Discharge Instructions (Signed)
Antibiotic Medicine °Antibiotic medicines are used to treat infections caused by bacteria. They work by injuring or killing the bacteria that is making you sick. °HOW IS AN ANTIBIOTIC CHOSEN? °An antibiotic is chosen based on many factors. To help your health care provider choose one for you, tell your health care provider if: °· You have any allergies. °· You are pregnant or plan to get pregnant. °· You are breastfeeding. °· You are taking any medicines. These include over-the-counter medicines, prescription medicines, and herbal remedies. °· You have a medical condition or problem you have not already discussed. °Your health care provider will also consider: °· How often the medicine has to be taken. °· Common side effects of the medicine. °· The cost of the medicine. °· The taste of the medicine. °If you have questions about why an antibiotic was chosen, make sure to ask. °FOR HOW LONG SHOULD I TAKE MY ANTIBIOTIC? °Continue to take your antibiotic for as long as told by your health care provider. Do not stop taking it when you feel better. If you stop taking it too soon: °· You may start to feel sick again. °· Your infection may become harder to treat. °· Complications may develop. °WHAT IF I MISS A DOSE? °Try not to miss any doses of medicine. If you miss a dose, take it as soon as possible. However, if it is almost time for the next dose: °· If you are taking 2 doses per day, take the missed dose and the next dose 5 to 6 hours apart. °· If you are taking 3 or more doses per day, take the missed dose and the next dose 2 to 4 hours apart, then go back to the normal schedule. °If you cannot make up a missed dose, take the next scheduled dose on time. Then take the missed dose after you have taken all the doses as recommended by your health care provider, as if you had one more dose left. °DO ANTIBIOTICS AFFECT BIRTH CONTROL? °Birth control pills may not work while you are on antibiotics. If you are taking birth  control pills, continue taking them as usual and use a second form of birth control, such as a condom, to avoid unwanted pregnancy. Continue using the second form of birth control until you are finished with your current 1 month cycle of birth control pills. °OTHER INFORMATION °· If there is any medicine left over, throw it away. °· Never take someone else's antibiotics. °· Never take leftover antibiotics. °SEEK MEDICAL CARE IF: °· You get worse. °· You do not feel better within a few days of starting the antibiotic medicine. °· You vomit. °· White patches appear in your mouth. °· You have new joint pain that begins after starting the antibiotic. °· You have new muscle aches that begin after starting the antibiotic. °· You had a fever before starting the antibiotic and it returns. °· You have any symptoms of an allergic reaction, such as an itchy rash. If this happens, stop taking the antibiotic. °SEEK IMMEDIATE MEDICAL CARE IF: °· Your urine turns dark or becomes blood-colored. °· Your skin turns yellow. °· You bruise or bleed easily. °· You have severe diarrhea and abdominal cramps. °· You have a severe headache. °· You have signs of a severe allergic reaction, such as: °¨ Trouble breathing. °¨ Wheezing. °¨ Swelling of the lips, tongue, or face. °¨ Fainting. °¨ Blisters on the skin or in the mouth. °If you have signs of a severe allergic   reaction, stop taking the antibiotic right away. °  °This information is not intended to replace advice given to you by your health care provider. Make sure you discuss any questions you have with your health care provider. °  °Document Released: 08/11/2004 Document Revised: 08/20/2015 Document Reviewed: 04/16/2015 °Elsevier Interactive Patient Education ©2016 [employer] ° °Urinary Tract Infection °Urinary tract infections (UTIs) can develop anywhere along your urinary tract. Your urinary tract is your body's drainage system for removing wastes and extra water. Your urinary  tract includes two kidneys, two ureters, a bladder, and a urethra. Your kidneys are a pair of bean-shaped organs. Each kidney is about the size of your fist. They are located below your ribs, one on each side of your spine. °CAUSES °Infections are caused by microbes, which are microscopic organisms, including fungi, viruses, and bacteria. These organisms are so small that they can only be seen through a microscope. Bacteria are the microbes that most commonly cause UTIs. °SYMPTOMS  °Symptoms of UTIs may vary by age and gender of the patient and by the location of the infection. Symptoms in young women typically include a frequent and intense urge to urinate and a painful, burning feeling in the bladder or urethra during urination. Older women and men are more likely to be tired, shaky, and weak and have muscle aches and abdominal pain. A fever may mean the infection is in your kidneys. Other symptoms of a kidney infection include pain in your back or sides below the ribs, nausea, and vomiting. °DIAGNOSIS °To diagnose a UTI, your caregiver will ask you about your symptoms. Your caregiver will also ask you to provide a urine sample. The urine sample will be tested for bacteria and white blood cells. White blood cells are made by your body to help fight infection. °TREATMENT  °Typically, UTIs can be treated with medication. Because most UTIs are caused by a bacterial infection, they usually can be treated with the use of antibiotics. The choice of antibiotic and length of treatment depend on your symptoms and the type of bacteria causing your infection. °HOME CARE INSTRUCTIONS °· If you were prescribed antibiotics, take them exactly as your caregiver instructs you. Finish the medication even if you feel better after you have only taken some of the medication. °· Drink enough water and fluids to keep your urine clear or pale yellow. °· Avoid caffeine, tea, and carbonated beverages. They tend to irritate your  bladder. °· Empty your bladder often. Avoid holding urine for long periods of time. °· Empty your bladder before and after sexual intercourse. °· After a bowel movement, women should cleanse from front to back. Use each tissue only once. °SEEK MEDICAL CARE IF:  °· You have back pain. °· You develop a fever. °· Your symptoms do not begin to resolve within 3 days. °SEEK IMMEDIATE MEDICAL CARE IF:  °· You have severe back pain or lower abdominal pain. °· You develop chills. °· You have nausea or vomiting. °· You have continued burning or discomfort with urination. °MAKE SURE YOU:  °· Understand these instructions. °· Will watch your condition. °· Will get help right away if you are not doing well or get worse. °  °This information is not intended to replace advice given to you by your health care provider. Make sure you discuss any questions you have with your health care provider. °  °Document Released: 09/08/2005 Document Revised: 08/20/2015 Document Reviewed: 01/07/2012 °Elsevier Interactive Patient Education ©2016 [employer] ° °

## 2016-03-03 NOTE — ED Notes (Signed)
Pt said that she was not waiting the 7 hrs and wanted to leave

## 2016-03-03 NOTE — ED Notes (Signed)
Pt. reports low abdominal pain with dysuria and urinary frequency onset this week , denies nausea or diarrhea , no fever or chills.

## 2016-03-03 NOTE — ED Notes (Signed)
PT reports urinary burning and frequency. PT has frequent UTIs. PT was seen here 1.5 weeks ago and treated for a UTI. Symptoms improved, but have returned. PT had a UA done in the ED early this AM

## 2016-03-04 LAB — URINE CULTURE
Culture: 6000
SPECIAL REQUESTS: NORMAL

## 2016-07-13 ENCOUNTER — Encounter (HOSPITAL_COMMUNITY): Payer: Self-pay | Admitting: Emergency Medicine

## 2016-07-13 ENCOUNTER — Emergency Department (HOSPITAL_COMMUNITY)
Admission: EM | Admit: 2016-07-13 | Discharge: 2016-07-13 | Disposition: A | Discharge status: Home / Self Care | Payer: Medicaid Other | Attending: Emergency Medicine | Admitting: Emergency Medicine

## 2016-07-13 DIAGNOSIS — E860 Dehydration: Secondary | ICD-10-CM | POA: Diagnosis not present

## 2016-07-13 DIAGNOSIS — F17210 Nicotine dependence, cigarettes, uncomplicated: Secondary | ICD-10-CM | POA: Insufficient documentation

## 2016-07-13 DIAGNOSIS — R42 Dizziness and giddiness: Secondary | ICD-10-CM | POA: Diagnosis present

## 2016-07-13 LAB — BASIC METABOLIC PANEL
ANION GAP: 4 — AB (ref 5–15)
BUN: 13 mg/dL (ref 6–20)
CHLORIDE: 107 mmol/L (ref 101–111)
CO2: 23 mmol/L (ref 22–32)
Calcium: 8.8 mg/dL — ABNORMAL LOW (ref 8.9–10.3)
Creatinine, Ser: 0.72 mg/dL (ref 0.44–1.00)
GFR calc non Af Amer: >60 mL/min (ref >60)
Glucose, Bld: 101 mg/dL — ABNORMAL HIGH (ref 65–99)
Potassium: 4.1 mmol/L (ref 3.5–5.1)
Sodium: 134 mmol/L — ABNORMAL LOW (ref 135–145)

## 2016-07-13 LAB — CBC
HCT: 38 % (ref 36.0–46.0)
HEMOGLOBIN: 12.3 g/dL (ref 12.0–15.0)
MCH: 26.8 pg (ref 26.0–34.0)
MCHC: 32.4 g/dL (ref 30.0–36.0)
MCV: 82.8 fL (ref 78.0–100.0)
Platelets: 205 10*3/uL (ref 150–400)
RBC: 4.59 MIL/uL (ref 3.87–5.11)
RDW: 14.6 % (ref 11.5–15.5)
WBC: 5.3 10*3/uL (ref 4.0–10.5)

## 2016-07-13 LAB — URINALYSIS, ROUTINE W REFLEX MICROSCOPIC
Bilirubin Urine: NEGATIVE
GLUCOSE, UA: NEGATIVE mg/dL
HGB URINE DIPSTICK: NEGATIVE
Ketones, ur: NEGATIVE mg/dL
Leukocytes, UA: NEGATIVE
Nitrite: NEGATIVE
Protein, ur: NEGATIVE mg/dL
SPECIFIC GRAVITY, URINE: 1.021 (ref 1.005–1.030)
pH: 6 (ref 5.0–8.0)

## 2016-07-13 LAB — PREGNANCY, URINE: PREG TEST UR: NEGATIVE

## 2016-07-13 LAB — CBG MONITORING, ED: GLUCOSE-CAPILLARY: 93 mg/dL (ref 65–99)

## 2016-07-13 MED ORDER — SODIUM CHLORIDE 0.9 % IV BOLUS (SEPSIS)
1000.0000 mL | Freq: Once | INTRAVENOUS | Status: AC
  Administered 2016-07-13: 1000 mL via INTRAVENOUS

## 2016-07-13 NOTE — Discharge Instructions (Signed)
Follow up with primary care provider for discussion of today's diagnosis. Increase hydration and eat regular meals or snacks.   Return to ER for new or worsening symptoms, any additional concerns.

## 2016-07-13 NOTE — Progress Notes (Signed)
Pt given a list uninsured resources to include medicaid providers Pt informed CM she had a pcp then informed CM she did not have a pcp

## 2016-07-13 NOTE — ED Triage Notes (Signed)
PER ems PATIENT came in for dizziness that started this morning when standing.  Patient worked over the weekend and didn't eat or didn't much.

## 2016-07-13 NOTE — ED Provider Notes (Signed)
[facility]-EMERGENCY DEPT Provider Note   CSN: [id number] Arrival date & time: 07/13/16  1202  First Provider Contact:  First MD Initiated Contact with Patient 07/13/16 1418      History   Chief Complaint Chief Complaint  Patient presents with  . Dizziness    HPI April Schaefer is a 30 y.o. female.  April Schaefer is a 30 y.o. female who presents to the Emergency Department complaining of dizziness described as feeling lightheaded and flushed that began acutely at around 11 AM this morning. She states that she woke up feeling in her state of usual health, she did not eat or drink anything today started doing chores. She was folding laundry when suddenly she began feeling very flushed and weak. She sat down and symptoms improved, however every time she tried to get up she would experience her vision darkening like tunnel vision and feel as if she needed to sit down again. She states that she had cheese nips and a piece of ham which did improve her symptoms. She has also been drinking water which she believes has helped. She feels improved after this, but still feels very weak and dry mouth. No medications taken prior to arrival. No history of similar events. Denies abdominal pain, chest pain, back pain, difficulty breathing, or headaches. No LOC.     The history is provided by the patient and medical records. No language interpreter was used.  Dizziness   Pertinent negatives include no nausea and no vomiting.    Past Medical History:  Diagnosis Date  . Anxiety   . Hx of bursitis    R hip  . Medical history non-contributory   . UTI (urinary tract infection)   . Vaginal Pap smear, abnormal     Patient Active Problem List   Diagnosis Date Noted  . Pregnancy 08/02/2014    Past Surgical History:  Procedure Laterality Date  . COLPOSCOPY      OB History    Gravida Para Term Preterm AB Living   7 4 4   3 4    SAB TAB Ectopic Multiple Live Births   2 1             Home  Medications    Prior to Admission medications   Medication Sig Start Date End Date Taking? Authorizing Provider  cephALEXin (KEFLEX) 500 MG capsule Take 1 capsule (500 mg total) by mouth 4 (four) times daily. Take all of medicine and drink lots of fluids Patient not taking: Reported on 07/13/2016 02/17/16   Linna Hoff, MD  ciprofloxacin (CIPRO) 500 MG tablet Take 1 tablet (500 mg total) by mouth 2 (two) times daily. Patient not taking: Reported on 07/13/2016 12/03/15   Linna Hoff, MD  fluconazole (DIFLUCAN) 150 MG tablet 1 tab po x 1. May repeat in 72 hours if no improvement Patient not taking: Reported on 07/13/2016 03/03/16   Hayden Rasmussen, NP  terconazole (TERAZOL 3) 80 MG vaginal suppository Place 1 suppository (80 mg total) vaginally at bedtime. Patient not taking: Reported on 07/13/2016 12/03/15   Linna Hoff, MD    Family History Family History  Problem Relation Age of Onset  . Alcohol abuse Neg Hx   . Arthritis Neg Hx   . Asthma Neg Hx   . Birth defects Neg Hx   . Cancer Neg Hx   . COPD Neg Hx   . Depression Neg Hx   . Diabetes Neg Hx   . Drug abuse Neg  Hx   . Early death Neg Hx   . Hearing loss Neg Hx   . Heart disease Neg Hx   . Hyperlipidemia Neg Hx   . Hypertension Neg Hx   . Kidney disease Neg Hx   . Learning disabilities Neg Hx   . Mental illness Neg Hx   . Mental retardation Neg Hx   . Miscarriages / Stillbirths Neg Hx   . Stroke Neg Hx   . Vision loss Neg Hx   . Varicose Veins Neg Hx     Social History Social History  Substance Use Topics  . Smoking status: Current Every Day Smoker    Packs/day: 0.25    Types: Cigarettes  . Smokeless tobacco: Never Used  . Alcohol use Yes     Allergies   Clindamycin/lincomycin; Metronidazole; and Nickel   Review of Systems Review of Systems  Constitutional: Negative for chills and fever.  HENT: Negative for congestion.   Eyes: Positive for visual disturbance (See HPI).  Respiratory: Negative for cough and  shortness of breath.   Cardiovascular: Negative.   Gastrointestinal: Negative for abdominal pain, nausea and vomiting.  Genitourinary: Negative for dysuria.  Musculoskeletal: Negative for back pain and myalgias.  Allergic/Immunologic: Negative for immunocompromised state.  Neurological: Positive for dizziness and light-headedness. Negative for syncope.     Physical Exam Updated Vital Signs BP 136/74 (BP Location: Left Arm)   Pulse 66   Temp 98.4 F (36.9 C) (Oral)   Resp 20   Ht 5\' 7"  (1.702 m)   Wt 70.8 kg   LMP 06/21/2016   SpO2 100%   BMI 24.43 kg/m   Physical Exam  Constitutional: She is oriented to person, place, and time. She appears well-developed and well-nourished.  NAD  HENT:  Head: Normocephalic and atraumatic.  Right Ear: Tympanic membrane, external ear and ear canal normal.  Left Ear: Tympanic membrane, external ear and ear canal normal.  Neck: Normal range of motion. Neck supple.  Cardiovascular: Normal rate, regular rhythm and normal heart sounds.  Exam reveals no gallop and no friction rub.   No murmur heard. Intact and equal pulses in all four extremities  Pulmonary/Chest: Effort normal and breath sounds normal. No respiratory distress. She has no wheezes. She has no rales.  Abdominal: Soft. She exhibits no distension. There is no tenderness.  Musculoskeletal: She exhibits no edema.  Neurological: She is alert and oriented to person, place, and time.  Alert, oriented, thought content appropriate, able to give a coherent history. Speech is clear and goal oriented, able to follow commands. Cranial Nerves II-XII grossly intact. 5/5 muscle strength in upper and lower extremities bilaterally including strong and equal grip strength and dorsiflexion/plantar flexion. Sensory to light touch normal in all four extremities. Normal finger-to-nose and rapid alternating movements; normal gait and balance. Negative romberg, no pronator drift.  Skin: No pallor.  Cap refill  < 3 seconds  Psychiatric: She has a normal mood and affect.  Nursing note and vitals reviewed.    ED Treatments / Results  Labs (all labs ordered are listed, but only abnormal results are displayed) Labs Reviewed  BASIC METABOLIC PANEL - Abnormal; Notable for the following:       Result Value   Sodium 134 (*)    Glucose, Bld 101 (*)    Calcium 8.8 (*)    Anion gap 4 (*)    All other components within normal limits  URINALYSIS, ROUTINE W REFLEX MICROSCOPIC (NOT AT [facility]) - Abnormal; Notable for  the following:    APPearance CLOUDY (*)    All other components within normal limits  CBC  PREGNANCY, URINE  CBG MONITORING, ED  I-STAT BETA HCG BLOOD, ED ([facility], [facility], [facility] ONLY)    EKG  EKG Interpretation  Date/Time:  Tuesday July 13 2016 15:20:26 EDT Ventricular Rate:  75 PR Interval:    QRS Duration: 94 QT Interval:  386 QTC Calculation: 432 R Axis:   7 Text Interpretation:  Sinus rhythm since last tracing no significant change Confirmed by BELFI  MD, MELANIE ([id number]) on 07/13/2016 4:27:50 PM Also confirmed by BELFI  MD, MELANIE ([id number]), editor [place], Jola Babinski [phone])  on 07/13/2016 4:53:54 PM       Radiology No results found.  Procedures Procedures (including critical care time)  Medications Ordered in ED Medications  sodium chloride 0.9 % bolus 1,000 mL (0 mLs Intravenous Stopped 07/13/16 1640)  sodium chloride 0.9 % bolus 1,000 mL (1,000 mLs Intravenous New Bag/Given 07/13/16 1640)     Initial Impression / Assessment and Plan / ED Course  I have reviewed the triage vital signs and the nursing notes.  Pertinent labs & imaging results that were available during my care of the patient were reviewed by me and considered in my medical decision making (see chart for details).  Clinical Course   April Schaefer presents to ED for dizziness described as lightheadedness and feeling flushed that began today. Decreased PO intake over the last few days. 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 Did not eat or drink anything this morning before symptoms began at 11:00am. EKG reviewed and reassuring. Upreg negative. UA, BMP, cbc reviewed and reassuring. Afebrile and hemodynamically stable. No focal neuro deficits. Symptoms resolved with 2L IVF and food. PCP follow up strongly encouraged. Patient reports case manager gave her a list of PCP's and that she will follow up. Increase hydration and eat regular meals or snacks. Reasons to return to ED discussed and all questions answered.   Final Clinical Impressions(s) / ED Diagnoses   Final diagnoses:  None    New Prescriptions New Prescriptions   No medications on file     [facility] Fremon Zacharia, PA-C 07/13/16 1748    Rolan Bucco, MD 07/13/16 1904

## 2016-07-13 NOTE — ED Notes (Signed)
Bed: NO17 Expected date:  Expected time:  Means of arrival:  Comments: 50F/SOB

## 2016-07-13 NOTE — ED Triage Notes (Signed)
Patient adds that she hasnt had anything to eat or drink today.  Patient was weak when she stood. Patient states that she was up cleaning the house and taking care of her kids.  EMS told patient when she stood her pressure dropped.

## 2017-05-02 ENCOUNTER — Encounter (HOSPITAL_COMMUNITY): Payer: Self-pay | Admitting: Emergency Medicine

## 2017-05-02 ENCOUNTER — Emergency Department (HOSPITAL_COMMUNITY)
Admission: EM | Admit: 2017-05-02 | Discharge: 2017-05-02 | Disposition: A | Discharge status: Home / Self Care | Payer: Medicaid Other | Attending: Emergency Medicine | Admitting: Emergency Medicine

## 2017-05-02 DIAGNOSIS — L509 Urticaria, unspecified: Secondary | ICD-10-CM | POA: Insufficient documentation

## 2017-05-02 DIAGNOSIS — F17210 Nicotine dependence, cigarettes, uncomplicated: Secondary | ICD-10-CM | POA: Insufficient documentation

## 2017-05-02 MED ORDER — HYDROXYZINE HCL 25 MG PO TABS: 25.0000 mg | ORAL_TABLET | Freq: Four times a day (QID) | ORAL | 0 refills | Status: AC

## 2017-05-02 MED ORDER — FAMOTIDINE 20 MG PO TABS: 20.0000 mg | ORAL_TABLET | Freq: Two times a day (BID) | ORAL | 0 refills | Status: AC

## 2017-05-02 MED ORDER — PREDNISONE 20 MG PO TABS: 40.0000 mg | ORAL_TABLET | Freq: Every day | ORAL | 0 refills | Status: AC

## 2017-05-02 MED ORDER — FAMOTIDINE 20 MG PO TABS
20.0000 mg | ORAL_TABLET | Freq: Once | ORAL | Status: AC
  Administered 2017-05-02: 20 mg via ORAL
  Filled 2017-05-02: qty 1

## 2017-05-02 MED ORDER — HYDROXYZINE HCL 25 MG PO TABS
25.0000 mg | ORAL_TABLET | Freq: Once | ORAL | Status: AC
  Administered 2017-05-02: 25 mg via ORAL
  Filled 2017-05-02: qty 1

## 2017-05-02 NOTE — ED Triage Notes (Signed)
Pt reports a rash over several parts of her legs and arms, that is itchy and warm to the touch.  She states that it makes her legs "feel heavy."  No SOB, dizziness, fevers

## 2017-05-02 NOTE — Discharge Instructions (Signed)
You were seen today for likely local allergic reaction or hives. It is unclear whether you had actually been bitten by some but it does appear that you are having an allergic response. You'll be started on Pepcid, Atarax, and it is unknown. If you have any new or worsening symptoms including shortness of breath, throat swelling or any new or worsening symptoms she needs to be reevaluated. There are no signs of infection at this time.

## 2017-05-02 NOTE — ED Notes (Signed)
Pt understood dc material. NAD noted. Scripts given at dc 

## 2017-05-02 NOTE — ED Provider Notes (Signed)
[facility]-EMERGENCY DEPT Provider Note   CSN: [id number] Arrival date & time: 05/02/17  0203     History   Chief Complaint Chief Complaint  Patient presents with  . Rash    HPI April Schaefer is a 31 y.o. female.  HPI  This a 31 year old female who presents with itchy rash. Patient states that she thinks "I'm being bit by something." She reports several week history of multiple sites over her legs and arms that had developed an itchy rash. She has not noticed any bugs or spiders biting her. Currently she has an itchy rash with redness and swelling over the right knee and the right proximal arm. She has taken Benadryl 3 times a day with minimal relief of itching. She denies any pain. She denies any fevers. No other known allergies. She denies chest pain, shortness of breath, throat swelling, any systemic symptoms.  Past Medical History:  Diagnosis Date  . Anxiety   . Hx of bursitis    R hip  . Medical history non-contributory   . UTI (urinary tract infection)   . Vaginal Pap smear, abnormal     Patient Active Problem List   Diagnosis Date Noted  . Pregnancy 08/02/2014    Past Surgical History:  Procedure Laterality Date  . COLPOSCOPY      OB History    Gravida Para Term Preterm AB Living   7 4 4   3 4    SAB TAB Ectopic Multiple Live Births   2 1     4        Home Medications    Prior to Admission medications   Medication Sig Start Date End Date Taking? Authorizing Provider  cephALEXin (KEFLEX) 500 MG capsule Take 1 capsule (500 mg total) by mouth 4 (four) times daily. Take all of medicine and drink lots of fluids Patient not taking: Reported on 07/13/2016 02/17/16   Linna Hoff, MD  ciprofloxacin (CIPRO) 500 MG tablet Take 1 tablet (500 mg total) by mouth 2 (two) times daily. Patient not taking: Reported on 07/13/2016 12/03/15   Linna Hoff, MD  famotidine (PEPCID) 20 MG tablet Take 1 tablet (20 mg total) by mouth 2 (two) times daily. 05/02/17   Horton, Mayer Masker,  MD  fluconazole (DIFLUCAN) 150 MG tablet 1 tab po x 1. May repeat in 72 hours if no improvement Patient not taking: Reported on 07/13/2016 03/03/16   Hayden Rasmussen, NP  hydrOXYzine (ATARAX/VISTARIL) 25 MG tablet Take 1 tablet (25 mg total) by mouth every 6 (six) hours. 05/02/17   Horton, Mayer Masker, MD  predniSONE (DELTASONE) 20 MG tablet Take 2 tablets (40 mg total) by mouth daily. 05/02/17   Horton, Mayer Masker, MD  terconazole (TERAZOL 3) 80 MG vaginal suppository Place 1 suppository (80 mg total) vaginally at bedtime. Patient not taking: Reported on 07/13/2016 12/03/15   Linna Hoff, MD    Family History Family History  Problem Relation Age of Onset  . Alcohol abuse Neg Hx   . Arthritis Neg Hx   . Asthma Neg Hx   . Birth defects Neg Hx   . Cancer Neg Hx   . COPD Neg Hx   . Depression Neg Hx   . Diabetes Neg Hx   . Drug abuse Neg Hx   . Early death Neg Hx   . Hearing loss Neg Hx   . Heart disease Neg Hx   . Hyperlipidemia Neg Hx   . Hypertension Neg Hx   .  Kidney disease Neg Hx   . Learning disabilities Neg Hx   . Mental illness Neg Hx   . Mental retardation Neg Hx   . Miscarriages / Stillbirths Neg Hx   . Stroke Neg Hx   . Vision loss Neg Hx   . Varicose Veins Neg Hx     Social History Social History  Substance Use Topics  . Smoking status: Current Every Day Smoker    Packs/day: 0.25    Types: Cigarettes  . Smokeless tobacco: Never Used  . Alcohol use Yes     Allergies   Clindamycin/lincomycin; Metronidazole; and Nickel   Review of Systems Review of Systems  Constitutional: Negative for fever.  HENT: Negative for trouble swallowing.   Respiratory: Negative for shortness of breath.   Cardiovascular: Negative for chest pain.  Skin: Positive for color change and rash.  All other systems reviewed and are negative.    Physical Exam Updated Vital Signs BP 118/78   Pulse 95   Temp 98.9 F (37.2 C) (Oral)   Ht 5\' 7"  (1.702 m)   Wt 146 lb (66.2 kg)   SpO2  100%   BMI 22.87 kg/m   Physical Exam  Constitutional: She is oriented to person, place, and time. She appears well-developed and well-nourished. No distress.  HENT:  Head: Normocephalic and atraumatic.  Cardiovascular: Normal rate, regular rhythm and normal heart sounds.   Pulmonary/Chest: Effort normal. No respiratory distress. She has no wheezes.  Musculoskeletal: She exhibits no edema.  Neurological: She is alert and oriented to person, place, and time.  Skin: Skin is warm and dry. Rash noted.  Large welt noted over the distal left thigh, no obvious bite, mild erythema and warmth, additional large welts noted over the posterior left forearm, no other rashes noted  Psychiatric: She has a normal mood and affect.  Nursing note and vitals reviewed.    ED Treatments / Results  Labs (all labs ordered are listed, but only abnormal results are displayed) Labs Reviewed - No data to display  EKG  EKG Interpretation None       Radiology No results found.  Procedures Procedures (including critical care time)  Medications Ordered in ED Medications  hydrOXYzine (ATARAX/VISTARIL) tablet 25 mg (25 mg Oral Given 05/02/17 0345)  famotidine (PEPCID) tablet 20 mg (20 mg Oral Given 05/02/17 0345)     Initial Impression / Assessment and Plan / ED Course  I have reviewed the triage vital signs and the nursing notes.  Pertinent labs & imaging results that were available during my care of the patient were reviewed by me and considered in my medical decision making (see chart for details).     She presents with appears to be welts over the left leg and left arm. No obvious bites. This could be a local allergic reaction versus hives. No signs or symptoms of infection. Given that it is itchy, feel this is likely related to histamine release. Patient was given Atarax and Pepcid. Will discharge home with Atarax, Pepcid, and prednisone.  After history, exam, and medical workup I feel the  patient has been appropriately medically screened and is safe for discharge home. Pertinent diagnoses were discussed with the patient. Patient was given return precautions.   Final Clinical Impressions(s) / ED Diagnoses   Final diagnoses:  Hives    New Prescriptions New Prescriptions   FAMOTIDINE (PEPCID) 20 MG TABLET    Take 1 tablet (20 mg total) by mouth 2 (two) times daily.  HYDROXYZINE (ATARAX/VISTARIL) 25 MG TABLET    Take 1 tablet (25 mg total) by mouth every 6 (six) hours.   PREDNISONE (DELTASONE) 20 MG TABLET    Take 2 tablets (40 mg total) by mouth daily.     Shon BatonHorton, Courtney F, MD 05/02/17 [phone]

## 2018-12-26 ENCOUNTER — Inpatient Hospital Stay (HOSPITAL_COMMUNITY): Payer: Self-pay

## 2018-12-26 ENCOUNTER — Inpatient Hospital Stay (HOSPITAL_COMMUNITY)
Admission: AD | Admit: 2018-12-26 | Discharge: 2018-12-26 | Disposition: A | Discharge status: Home / Self Care | Payer: Self-pay | Attending: Obstetrics and Gynecology | Admitting: Obstetrics and Gynecology

## 2018-12-26 ENCOUNTER — Encounter (HOSPITAL_COMMUNITY): Payer: Self-pay | Admitting: Certified Nurse Midwife

## 2018-12-26 DIAGNOSIS — O4691 Antepartum hemorrhage, unspecified, first trimester: Secondary | ICD-10-CM

## 2018-12-26 DIAGNOSIS — O26891 Other specified pregnancy related conditions, first trimester: Secondary | ICD-10-CM

## 2018-12-26 DIAGNOSIS — R109 Unspecified abdominal pain: Secondary | ICD-10-CM

## 2018-12-26 DIAGNOSIS — O4690 Antepartum hemorrhage, unspecified, unspecified trimester: Secondary | ICD-10-CM

## 2018-12-26 DIAGNOSIS — Z6791 Unspecified blood type, Rh negative: Secondary | ICD-10-CM

## 2018-12-26 DIAGNOSIS — O209 Hemorrhage in early pregnancy, unspecified: Secondary | ICD-10-CM | POA: Insufficient documentation

## 2018-12-26 DIAGNOSIS — Z3A01 Less than 8 weeks gestation of pregnancy: Secondary | ICD-10-CM

## 2018-12-26 DIAGNOSIS — Z3491 Encounter for supervision of normal pregnancy, unspecified, first trimester: Secondary | ICD-10-CM

## 2018-12-26 LAB — URINALYSIS, ROUTINE W REFLEX MICROSCOPIC
Bilirubin Urine: NEGATIVE
Glucose, UA: NEGATIVE mg/dL
Ketones, ur: NEGATIVE mg/dL
Nitrite: NEGATIVE
Protein, ur: NEGATIVE mg/dL
Specific Gravity, Urine: 1.023 (ref 1.005–1.030)
pH: 6 (ref 5.0–8.0)

## 2018-12-26 LAB — CBC
HCT: 37.1 % (ref 36.0–46.0)
Hemoglobin: 12 g/dL (ref 12.0–15.0)
MCH: 28.3 pg (ref 26.0–34.0)
MCHC: 32.3 g/dL (ref 30.0–36.0)
MCV: 87.5 fL (ref 80.0–100.0)
Platelets: 162 10*3/uL (ref 150–400)
RBC: 4.24 MIL/uL (ref 3.87–5.11)
RDW: 14.1 % (ref 11.5–15.5)
WBC: 6.5 10*3/uL (ref 4.0–10.5)
nRBC: 0 % (ref 0.0–0.2)

## 2018-12-26 LAB — WET PREP, GENITAL
Sperm: NONE SEEN
Trich, Wet Prep: NONE SEEN
Yeast Wet Prep HPF POC: NONE SEEN

## 2018-12-26 LAB — POCT PREGNANCY, URINE: PREG TEST UR: POSITIVE — AB

## 2018-12-26 LAB — HCG, QUANTITATIVE, PREGNANCY: hCG, Beta Chain, Quant, S: 112031 m[IU]/mL — ABNORMAL HIGH (ref <5)

## 2018-12-26 MED ORDER — RHO D IMMUNE GLOBULIN 1500 UNIT/2ML IJ SOSY
300.0000 ug | PREFILLED_SYRINGE | Freq: Once | INTRAMUSCULAR | Status: AC
  Administered 2018-12-26: 300 ug via INTRAMUSCULAR
  Filled 2018-12-26: qty 2

## 2018-12-26 NOTE — MAU Note (Signed)
Had a positive HPT a week ago.  Started having some light vaginal bleeding tonight.  Last intercourse yesterday.  LMP 10/16/2018.  Abdominal cramping that woke her up from her sleep.

## 2018-12-26 NOTE — MAU Provider Note (Signed)
History     CSN: [id number]  Arrival date and time: 12/26/18 0334   Chief Complaint  Patient presents with  . Vaginal Bleeding   April Schaefer is a 33 y.o. G8P4 at [facility] per patient by conception date who presents to MAU with complaints of abdominal pain and vaginal bleeding. She reports abdominal pain started about a hour prior to arrival to MAU, reports waking up out of sleep "with painful contractions", rates pain 5/10- has not taken any medication for abdominal pain. Reports vaginal bleeding being associated with abdominal pain that she noted on en route to MAU, describes vaginal bleeding as bright red spotting when she wipes, IC last night prior to bed. Denies passing clots or tissue. +HPT last week.    OB History    Gravida  8   Para  4   Term  4   Preterm      AB  3   Living  4     SAB  2   TAB  1   Ectopic      Multiple      Live Births  4           Past Medical History:  Diagnosis Date  . Anxiety   . Hx of bursitis    R hip  . Medical history non-contributory   . UTI (urinary tract infection)   . Vaginal Pap smear, abnormal     Past Surgical History:  Procedure Laterality Date  . COLPOSCOPY      Family History  Problem Relation Age of Onset  . Alcohol abuse Neg Hx   . Arthritis Neg Hx   . Asthma Neg Hx   . Birth defects Neg Hx   . Cancer Neg Hx   . COPD Neg Hx   . Depression Neg Hx   . Diabetes Neg Hx   . Drug abuse Neg Hx   . Early death Neg Hx   . Hearing loss Neg Hx   . Heart disease Neg Hx   . Hyperlipidemia Neg Hx   . Hypertension Neg Hx   . Kidney disease Neg Hx   . Learning disabilities Neg Hx   . Mental illness Neg Hx   . Mental retardation Neg Hx   . Miscarriages / Stillbirths Neg Hx   . Stroke Neg Hx   . Vision loss Neg Hx   . Varicose Veins Neg Hx     Social History   Tobacco Use  . Smoking status: Current Every Day Smoker    Packs/day: 0.25    Types: Cigarettes  . Smokeless tobacco: Never Used   Substance Use Topics  . Alcohol use: Yes  . Drug use: No    Allergies:  Allergies  Allergen Reactions  . Clindamycin/Lincomycin Hives, Itching and Swelling  . Metronidazole Hives, Itching and Swelling  . Nickel Rash    Medications Prior to Admission  Medication Sig Dispense Refill Last Dose  . cephALEXin (KEFLEX) 500 MG capsule Take 1 capsule (500 mg total) by mouth 4 (four) times daily. Take all of medicine and drink lots of fluids (Patient not taking: Reported on 07/13/2016) 20 capsule 0 Not Taking at Unknown time  . ciprofloxacin (CIPRO) 500 MG tablet Take 1 tablet (500 mg total) by mouth 2 (two) times daily. (Patient not taking: Reported on 07/13/2016) 14 tablet 0 Not Taking at Unknown time  . famotidine (PEPCID) 20 MG tablet Take 1 tablet (20 mg total) by mouth 2 (two) times daily.  30 tablet 0   . fluconazole (DIFLUCAN) 150 MG tablet 1 tab po x 1. May repeat in 72 hours if no improvement (Patient not taking: Reported on 07/13/2016) 2 tablet 0 Not Taking at Unknown time  . hydrOXYzine (ATARAX/VISTARIL) 25 MG tablet Take 1 tablet (25 mg total) by mouth every 6 (six) hours. 12 tablet 0   . predniSONE (DELTASONE) 20 MG tablet Take 2 tablets (40 mg total) by mouth daily. 10 tablet 0   . terconazole (TERAZOL 3) 80 MG vaginal suppository Place 1 suppository (80 mg total) vaginally at bedtime. (Patient not taking: Reported on 07/13/2016) 3 suppository 0 Not Taking at Unknown time    Review of Systems  Constitutional: Negative.   Respiratory: Negative.   Cardiovascular: Negative.   Gastrointestinal: Positive for abdominal pain. Negative for constipation, diarrhea, nausea and vomiting.  Genitourinary: Positive for vaginal bleeding. Negative for difficulty urinating, dysuria, frequency, hematuria and urgency.  Musculoskeletal: Negative.    Physical Exam   Blood pressure (!) 123/57, pulse 72, temperature 98.4 F (36.9 C), resp. rate 17, height 5\' 7"  (1.702 m), weight 70.6 kg, SpO2 100 %,  unknown if currently breastfeeding.  Physical Exam  Nursing note and vitals reviewed. Constitutional: She is oriented to person, place, and time. She appears well-developed and well-nourished. No distress.  Cardiovascular: Normal rate, regular rhythm and normal heart sounds.  Respiratory: Effort normal and breath sounds normal. No respiratory distress. She has no wheezes. She has no rales.  GI: Soft. Bowel sounds are normal. She exhibits no distension. There is no abdominal tenderness. There is no rebound.  Genitourinary:    Vaginal bleeding present.  There is bleeding in the vagina.    Genitourinary Comments: Pelvic examination: cervix pink, visually closed, scant dark red vaginal bleeding present around cervix- no active bleeding from cervical os (swabs collected), negative CMT, uterus non tender and no abnormalities of adnexa palpated.    Neurological: She is alert and oriented to person, place, and time.  Psychiatric: She has a normal mood and affect. Her behavior is normal. Thought content normal.   MAU Course  Procedures  MDM Orders Placed This Encounter  Procedures  . Wet prep, genital  . US OB LESS THAN 14 WEEKS WITH OB TRANSVAGINAL  . Urinalysis, Routine w reflex microscopic  . CBC  . hCG, quantitative, pregnancy  . Pregnancy, urine POC  . Rh IG workup (includes ABO/Rh)  . BLOOD TRANSFUSION REPORT - SCANNED   Patient is Rh negative- Rhogam workup and Rhogam given in MAU prior to discharge  Labs and [place] report reviewed:  Results for orders placed or performed during the hospital encounter of 12/26/18 (from the past 48 hour(s))  GC/Chlamydia probe amp ([place])not at [facility]     Status: None   Collection Time: 12/26/18 12:00 AM  Result Value Ref Range   Chlamydia Negative     Comment: Normal Reference Range - Negative   Neisseria gonorrhea Negative     Comment: Normal Reference Range - Negative  Urinalysis, Routine w reflex microscopic     Status: Abnormal   Collection  Time: 12/26/18  3:50 AM  Result Value Ref Range   Color, Urine YELLOW YELLOW   APPearance CLOUDY (A) CLEAR   Specific Gravity, Urine 1.023 1.005 - 1.030   pH 6.0 5.0 - 8.0   Glucose, UA NEGATIVE NEGATIVE mg/dL   Hgb urine dipstick MODERATE (A) NEGATIVE   Bilirubin Urine NEGATIVE NEGATIVE   Ketones, ur NEGATIVE NEGATIVE mg/dL   Protein,  ur NEGATIVE NEGATIVE mg/dL   Nitrite NEGATIVE NEGATIVE   Leukocytes, UA TRACE (A) NEGATIVE   RBC / HPF 0-5 0 - 5 RBC/hpf   WBC, UA 6-10 0 - 5 WBC/hpf   Bacteria, UA RARE (A) NONE SEEN   Squamous Epithelial / LPF 11-20 0 - 5   Mucus PRESENT     Comment: Performed at [facility], [address] [place], [place] [address]  Pregnancy, urine POC     Status: Abnormal   Collection Time: 12/26/18  3:55 AM  Result Value Ref Range   Preg Test, Ur POSITIVE (A) NEGATIVE    Comment:        THE SENSITIVITY OF THIS METHODOLOGY IS >24 mIU/mL   Wet prep, genital     Status: Abnormal   Collection Time: 12/26/18  4:04 AM  Result Value Ref Range   Yeast Wet Prep HPF POC NONE SEEN NONE SEEN   Trich, Wet Prep NONE SEEN NONE SEEN   Clue Cells Wet Prep HPF POC PRESENT (A) NONE SEEN   WBC, Wet Prep HPF POC FEW (A) NONE SEEN    Comment: MODERATE BACTERIA SEEN   Sperm NONE SEEN     Comment: Performed at [facility], [address] [place], [place] [address]  CBC     Status: None   Collection Time: 12/26/18  4:06 AM  Result Value Ref Range   WBC 6.5 4.0 - 10.5 K/uL   RBC 4.24 3.87 - 5.11 MIL/uL   Hemoglobin 12.0 12.0 - 15.0 g/dL   HCT 21.337.1 08.636.0 - 57.846.0 %   MCV 87.5 80.0 - 100.0 fL   MCH 28.3 26.0 - 34.0 pg   MCHC 32.3 30.0 - 36.0 g/dL   RDW 46.914.1 62.911.5 - 52.815.5 %   Platelets 162 150 - 400 K/uL   nRBC 0.0 0.0 - 0.2 %    Comment: Performed at [facility], [address] [place], [place] [address]  hCG, quantitative, pregnancy     Status: Abnormal   Collection Time: 12/26/18  4:06 AM  Result Value Ref Range   hCG, Beta Chain, Quant, S 112,031 (H) <5  mIU/mL    Comment:          GEST. AGE      CONC.  (mIU/mL)   <=1 WEEK        5 - 50     2 WEEKS       50 - 500     3 WEEKS       100 - 10,000     4 WEEKS     1,000 - 30,000     5 WEEKS     3,500 - 115,000   6-8 WEEKS     12,000 - 270,000    12 WEEKS     15,000 - 220,000        FEMALE AND NON-PREGNANT FEMALE:     LESS THAN 5 mIU/mL Performed at [facility], [address] [place], [place] [address]   Rh IG workup (includes ABO/Rh)     Status: None   Collection Time: 12/26/18  4:06 AM  Result Value Ref Range   Gestational Age(Wks) 7    ABO/RH(D) O NEG    Antibody Screen NEG    Unit Number [id number]    Blood Component Type RHIG    Unit division 00    Status of Unit ISSUED,FINAL    Transfusion Status      OK TO TRANSFUSE Performed at [facility], [address] [place], [place] [address]    [place] Ob Less Than 14 Weeks With Ob Transvaginal  Result Date: 12/26/2018 CLINICAL DATA:  33 y/o  F; vaginal bleeding and cramping. EXAM: OBSTETRIC <14 WK US AND TRANSVAGINAL OB US TECHNIQUE: Both transabdominal and transvaginal ultrasound examinations were performed for complete evaluation of the gestation as well as the maternal uterus, adnexal regions, and pelvic cul-de-sac. Transvaginal technique was performed to assess early pregnancy. COMPARISON:  None. FINDINGS: Intrauterine gestational sac: Single Yolk sac:  Visualized. Embryo:  Visualized. Cardiac Activity: Visualized. Heart Rate: 107 bpm CRL: 5.6 mm mm   6 w   2 d                  US EDC: 08/19/2019 Subchorionic hemorrhage:  None visualized. Maternal uterus/adnexae: Right ovary measures 2.6 x 1.8 x 2.4 cm. Small right ovarian corpus luteum. Left ovary measures 3.0 x 1.0 x 1.3 cm. IMPRESSION: Single live intrauterine pregnancy. Estimated gestational age [redacted] weeks 2 days. Electronically Signed   By: Mitzi HansenLance  Furusawa-Stratton M.D.   On: 12/26/2018 04:52   Discussed results of US and labs with patient. Educated on abstaining from IC for  7 days after vaginal bleeding, patient verbalizes understanding. No visualization of SCH seen on US. Discussed reasons to return to MAU and safe medications to take during pregnancy for abdominal pain. Pt stable prior to discharge home  Assessment and Plan   1. Normal intrauterine pregnancy on prenatal ultrasound in first trimester   2. Vaginal bleeding during pregnancy, antepartum   3. Abdominal pain during pregnancy in first trimester   4. [redacted] weeks gestation of pregnancy   5. Rh negative state in antepartum period, first trimester    Discharge home Make appointment to initiate prenatal care  Return to MAU as needed Safe medications during pregnancy  Pelvic rest for 7 days after post coitus bleeding   Sharyon CableVeronica C Edris Schneck CNM 12/26/2018, 5:02 AM

## 2018-12-27 LAB — RH IG WORKUP (INCLUDES ABO/RH)
ABO/RH(D): O NEG
Antibody Screen: NEGATIVE
Gestational Age(Wks): 7
Unit division: 0

## 2018-12-27 LAB — GC/CHLAMYDIA PROBE AMP (~~LOC~~) NOT AT ARMC
Chlamydia: NEGATIVE
Neisseria Gonorrhea: NEGATIVE

## 2019-11-11 ENCOUNTER — Encounter (HOSPITAL_COMMUNITY): Payer: Self-pay

## 2019-12-02 ENCOUNTER — Encounter (HOSPITAL_COMMUNITY): Payer: Self-pay

## 2019-12-02 ENCOUNTER — Emergency Department (HOSPITAL_COMMUNITY)
Admission: EM | Admit: 2019-12-02 | Discharge: 2019-12-02 | Disposition: A | Discharge status: Home / Self Care | Payer: Medicaid Other | Attending: Emergency Medicine | Admitting: Emergency Medicine

## 2019-12-02 ENCOUNTER — Other Ambulatory Visit: Payer: Self-pay

## 2019-12-02 DIAGNOSIS — F17210 Nicotine dependence, cigarettes, uncomplicated: Secondary | ICD-10-CM | POA: Insufficient documentation

## 2019-12-02 DIAGNOSIS — Z79899 Other long term (current) drug therapy: Secondary | ICD-10-CM | POA: Insufficient documentation

## 2019-12-02 DIAGNOSIS — J029 Acute pharyngitis, unspecified: Secondary | ICD-10-CM | POA: Insufficient documentation

## 2019-12-02 DIAGNOSIS — R350 Frequency of micturition: Secondary | ICD-10-CM | POA: Insufficient documentation

## 2019-12-02 LAB — URINALYSIS, ROUTINE W REFLEX MICROSCOPIC
Bilirubin Urine: NEGATIVE
Glucose, UA: NEGATIVE mg/dL
Hgb urine dipstick: NEGATIVE
Ketones, ur: NEGATIVE mg/dL
Leukocytes,Ua: NEGATIVE
Nitrite: NEGATIVE
Protein, ur: NEGATIVE mg/dL
Specific Gravity, Urine: 1.009 (ref 1.005–1.030)
pH: 7 (ref 5.0–8.0)

## 2019-12-02 LAB — GROUP A STREP BY PCR: Group A Strep by PCR: NOT DETECTED

## 2019-12-02 LAB — PREGNANCY, URINE: Preg Test, Ur: NEGATIVE

## 2019-12-02 MED ORDER — FLUCONAZOLE 150 MG PO TABS: 150.0000 mg | ORAL_TABLET | Freq: Every day | ORAL | 0 refills | Status: AC

## 2019-12-02 MED ORDER — LIDOCAINE VISCOUS HCL 2 % MT SOLN: 15.0000 mL | OROMUCOSAL | 0 refills | Status: AC | PRN

## 2019-12-02 MED ORDER — AMOXICILLIN 500 MG PO CAPS: 1000.0000 mg | ORAL_CAPSULE | Freq: Every day | ORAL | 0 refills | Status: AC

## 2019-12-02 NOTE — ED Provider Notes (Signed)
[facility]-EMERGENCY DEPT Provider Note   CSN: [id number] Arrival date & time: 12/02/19  1401     History Chief Complaint  Patient presents with  . Sore Throat  . Urinary Frequency    April Schaefer is a 33 y.o. female with a past medical history of anxiety, recurrent UTIs presenting to the ED with multiple complaints. Her first complaint is sore throat.  She noticed a sore throat 2 days ago.  Reports pain worse with swallowing.  No improvement noted with cough drops and BC powders.  Reports chills but denies any fevers.  She has a history of recurrent strep throat and states that this feels similar.  Denies any sick contacts with similar symptoms, cough, neck pain, trismus, drooling or abdominal pain. Her next complaint is urinary frequency.  States that usually with her UTI she gets dysuria but denies any dysuria today.  Symptoms began about 2 days ago as well.  She denies any vaginal discharge, back pain, fevers.  She is unsure if she could be pregnant.  HPI     Past Medical History:  Diagnosis Date  . Anxiety   . Hx of bursitis    R hip  . Medical history non-contributory   . UTI (urinary tract infection)   . Vaginal Pap smear, abnormal     Patient Active Problem List   Diagnosis Date Noted  . Pregnancy 08/02/2014    Past Surgical History:  Procedure Laterality Date  . COLPOSCOPY       OB History    Gravida  8   Para  4   Term  4   Preterm      AB  3   Living  4     SAB  2   TAB  1   Ectopic      Multiple      Live Births  4           Family History  Problem Relation Age of Onset  . Alcohol abuse Neg Hx   . Arthritis Neg Hx   . Asthma Neg Hx   . Birth defects Neg Hx   . Cancer Neg Hx   . COPD Neg Hx   . Depression Neg Hx   . Diabetes Neg Hx   . Drug abuse Neg Hx   . Early death Neg Hx   . Hearing loss Neg Hx   . Heart disease Neg Hx   . Hyperlipidemia Neg Hx   . Hypertension Neg Hx   . Kidney disease  Neg Hx   . Learning disabilities Neg Hx   . Mental illness Neg Hx   . Mental retardation Neg Hx   . Miscarriages / Stillbirths Neg Hx   . Stroke Neg Hx   . Vision loss Neg Hx   . Varicose Veins Neg Hx     Social History   Tobacco Use  . Smoking status: Current Every Day Smoker    Packs/day: 0.25    Types: Cigarettes  . Smokeless tobacco: Never Used  Substance Use Topics  . Alcohol use: Yes  . Drug use: No    Home Medications Prior to Admission medications   Medication Sig Start Date End Date Taking? Authorizing Provider  amoxicillin (AMOXIL) 500 MG capsule Take 2 capsules (1,000 mg total) by mouth daily for 10 days. 12/02/19 12/12/19  Lasha Echeverria, PA-C  famotidine (PEPCID) 20 MG tablet Take 1 tablet (20 mg total) by mouth 2 (two) times daily.  05/02/17   Horton, Mayer Maskerourtney F, MD  fluconazole (DIFLUCAN) 150 MG tablet Take 1 tablet (150 mg total) by mouth daily for 2 days. 12/02/19 12/04/19  Zymiere Trostle, PA-C  hydrOXYzine (ATARAX/VISTARIL) 25 MG tablet Take 1 tablet (25 mg total) by mouth every 6 (six) hours. 05/02/17   Horton, Mayer Maskerourtney F, MD  lidocaine (XYLOCAINE) 2 % solution Use as directed 15 mLs in the mouth or throat as needed for mouth pain. 12/02/19   Chrystian Ressler, PA-C  predniSONE (DELTASONE) 20 MG tablet Take 2 tablets (40 mg total) by mouth daily. 05/02/17   Horton, Mayer Maskerourtney F, MD    Allergies    Clindamycin/lincomycin, Metronidazole, and Nickel  Review of Systems   Review of Systems  Constitutional: Negative for appetite change, chills and fever.  HENT: Positive for sore throat. Negative for ear pain, rhinorrhea, sneezing and trouble swallowing.   Eyes: Negative for photophobia and visual disturbance.  Respiratory: Negative for cough, chest tightness, shortness of breath and wheezing.   Cardiovascular: Negative for chest pain and palpitations.  Gastrointestinal: Negative for abdominal pain, blood in stool, constipation, diarrhea, nausea and vomiting.  Genitourinary:  Positive for frequency. Negative for dysuria, hematuria and urgency.  Musculoskeletal: Negative for myalgias.  Skin: Negative for rash.  Neurological: Negative for dizziness, weakness and light-headedness.    Physical Exam Updated Vital Signs BP (!) 142/95 (BP Location: Left Arm)   Pulse 91   Temp 99.3 F (37.4 C) (Oral)   Resp 16   SpO2 100%   Physical Exam Vitals and nursing note reviewed.  Constitutional:      General: She is not in acute distress.    Appearance: She is well-developed.  HENT:     Head: Normocephalic and atraumatic.     Nose: Nose normal.     Mouth/Throat:     Tonsils: Tonsillar exudate present. 1+ on the right. 1+ on the left.     Comments: Bilaterally, symmetrically minimally enlarged tonsils with few exudates. Patient does not appear to be in acute distress. No trismus or drooling present. No pooling of secretions. Patient is tolerating secretions and is not in respiratory distress. No neck pain or tenderness to palpation of the neck. Full active and passive range of motion of the neck. No evidence of RPA or PTA. Eyes:     General: No scleral icterus.       Left eye: No discharge.     Conjunctiva/sclera: Conjunctivae normal.  Cardiovascular:     Rate and Rhythm: Normal rate and regular rhythm.     Heart sounds: Normal heart sounds. No murmur. No friction rub. No gallop.   Pulmonary:     Effort: Pulmonary effort is normal. No respiratory distress.     Breath sounds: Normal breath sounds.  Abdominal:     General: Bowel sounds are normal. There is no distension.     Palpations: Abdomen is soft.     Tenderness: There is no abdominal tenderness. There is no guarding.  Musculoskeletal:        General: Normal range of motion.     Cervical back: Normal range of motion and neck supple.  Skin:    General: Skin is warm and dry.     Findings: No rash.  Neurological:     Mental Status: She is alert.     Motor: No abnormal muscle tone.     Coordination:  Coordination normal.     ED Results / Procedures / Treatments   Labs (all labs ordered are  listed, but only abnormal results are displayed) Labs Reviewed  GROUP A STREP BY PCR  URINALYSIS, ROUTINE W REFLEX MICROSCOPIC  PREGNANCY, URINE    EKG None  Radiology No results found.  Procedures Procedures (including critical care time)  Medications Ordered in ED Medications - No data to display  ED Course  I have reviewed the triage vital signs and the nursing notes.  Pertinent labs & imaging results that were available during my care of the patient were reviewed by me and considered in my medical decision making (see chart for details).    MDM Rules/Calculators/A&P                      Pt rapid strep test negative. Pt is tolerating secretions, not in respiratory distress, no neck pain, no trismus. Presentation not concerning for peritonsillar abscess or spread of infection to deep spaces of the throat; patent airway. She is concerned due to her history of recurrent strep infections and states that this feels similar so she is requesting treatment.  Pt will be discharged with amoxicillin, and Diflucan as she has history of yeast infections when on antibiotics. Ibuprofen or Tylenol as needed for pain/fever. UA and Upreg is negative. Specific return precautions discussed. Recommended PCP follow up. Pt appears safe for discharge.   Patient is hemodynamically stable, in NAD, and able to ambulate in the ED. Evaluation does not show pathology that would require ongoing emergent intervention or inpatient treatment. I explained the diagnosis to the patient. Pain has been managed and has no complaints prior to discharge. Patient is comfortable with above plan and is stable for discharge at this time. All questions were answered prior to disposition. Strict return precautions for returning to the ED were discussed. Encouraged follow up with PCP.   An After Visit Summary was printed and given to  the patient.   Portions of this note were generated with Lobbyist. Dictation errors may occur despite best attempts at proofreading.  Final Clinical Impression(s) / ED Diagnoses Final diagnoses:  Pharyngitis, unspecified etiology    Rx / DC Orders ED Discharge Orders         Ordered    amoxicillin (AMOXIL) 500 MG capsule  Daily     12/02/19 1607    lidocaine (XYLOCAINE) 2 % solution  As needed     12/02/19 1607    fluconazole (DIFLUCAN) 150 MG tablet  Daily     12/02/19 1607           Delia Heady, PA-C 12/02/19 1609    Charlesetta Shanks, MD 12/02/19 1715

## 2019-12-02 NOTE — Discharge Instructions (Signed)
Take the antibiotics as directed.  Please complete the entire course of this medication regardless of symptom improvement to prevent worsening or recurrence of your infection. You can take the Diflucan to help with the yeast infection. Swish and spit lidocaine solution to help with throat discomfort. Do not swallow. Return to ED for worsening symptoms, trouble breathing or trouble swallowing, trouble moving your neck or opening your mouth.

## 2019-12-02 NOTE — ED Triage Notes (Signed)
Pt reports she has had a sore throat x2-3 days as well as urinary frequency. Pt has hx of UTIs.

## 2019-12-31 ENCOUNTER — Emergency Department (HOSPITAL_COMMUNITY)
Admission: EM | Admit: 2019-12-31 | Discharge: 2019-12-31 | Disposition: A | Discharge status: Home / Self Care | Payer: Medicaid Other | Attending: Emergency Medicine | Admitting: Emergency Medicine

## 2019-12-31 ENCOUNTER — Encounter (HOSPITAL_COMMUNITY): Payer: Self-pay | Admitting: Emergency Medicine

## 2019-12-31 ENCOUNTER — Other Ambulatory Visit: Payer: Self-pay

## 2019-12-31 DIAGNOSIS — Z79899 Other long term (current) drug therapy: Secondary | ICD-10-CM | POA: Insufficient documentation

## 2019-12-31 DIAGNOSIS — N939 Abnormal uterine and vaginal bleeding, unspecified: Secondary | ICD-10-CM | POA: Insufficient documentation

## 2019-12-31 DIAGNOSIS — F17210 Nicotine dependence, cigarettes, uncomplicated: Secondary | ICD-10-CM | POA: Insufficient documentation

## 2019-12-31 LAB — COMPREHENSIVE METABOLIC PANEL
ALT: 10 U/L (ref 0–44)
AST: 15 U/L (ref 15–41)
Albumin: 3.7 g/dL (ref 3.5–5.0)
Alkaline Phosphatase: 31 U/L — ABNORMAL LOW (ref 38–126)
Anion gap: 6 (ref 5–15)
BUN: 15 mg/dL (ref 6–20)
CO2: 26 mmol/L (ref 22–32)
Calcium: 8.9 mg/dL (ref 8.9–10.3)
Chloride: 106 mmol/L (ref 98–111)
Creatinine, Ser: 0.76 mg/dL (ref 0.44–1.00)
GFR calc Af Amer: >60 mL/min (ref >60)
GFR calc non Af Amer: >60 mL/min (ref >60)
Glucose, Bld: 78 mg/dL (ref 70–99)
Potassium: 3.8 mmol/L (ref 3.5–5.1)
Sodium: 138 mmol/L (ref 135–145)
Total Bilirubin: 0.4 mg/dL (ref 0.3–1.2)
Total Protein: 6.7 g/dL (ref 6.5–8.1)

## 2019-12-31 LAB — CBC WITH DIFFERENTIAL/PLATELET
Abs Immature Granulocytes: 0.04 10*3/uL (ref 0.00–0.07)
Basophils Absolute: 0.1 10*3/uL (ref 0.0–0.1)
Basophils Relative: 1 %
Eosinophils Absolute: 0.1 10*3/uL (ref 0.0–0.5)
Eosinophils Relative: 2 %
HCT: 39.3 % (ref 36.0–46.0)
Hemoglobin: 12.3 g/dL (ref 12.0–15.0)
Immature Granulocytes: 1 %
Lymphocytes Relative: 41 %
Lymphs Abs: 3.4 10*3/uL (ref 0.7–4.0)
MCH: 27.6 pg (ref 26.0–34.0)
MCHC: 31.3 g/dL (ref 30.0–36.0)
MCV: 88.3 fL (ref 80.0–100.0)
Monocytes Absolute: 0.5 10*3/uL (ref 0.1–1.0)
Monocytes Relative: 6 %
Neutro Abs: 4.2 10*3/uL (ref 1.7–7.7)
Neutrophils Relative %: 49 %
Platelets: 200 10*3/uL (ref 150–400)
RBC: 4.45 MIL/uL (ref 3.87–5.11)
RDW: 14.4 % (ref 11.5–15.5)
WBC: 8.4 10*3/uL (ref 4.0–10.5)
nRBC: 0 % (ref 0.0–0.2)

## 2019-12-31 LAB — I-STAT BETA HCG BLOOD, ED (MC, WL, AP ONLY): I-stat hCG, quantitative: <5 m[IU]/mL (ref <5)

## 2019-12-31 MED ORDER — MEGESTROL ACETATE 40 MG PO TABS
40.0000 mg | ORAL_TABLET | Freq: Every day | ORAL | Status: DC
  Administered 2019-12-31: 40 mg via ORAL
  Filled 2019-12-31: qty 1

## 2019-12-31 MED ORDER — MEGESTROL ACETATE 40 MG PO TABS: 40.0000 mg | ORAL_TABLET | Freq: Every day | ORAL | 0 refills | Status: AC

## 2019-12-31 NOTE — Discharge Instructions (Signed)
Take megace as prescribed. Make sure you are staying well hydrated with water.  It is extremely important that you follow-up with the OB/GYN.  You may follow-up with the clinic you have seen before, or the women's clinic which is listed below. Return to the emergency room if you develop fevers, nausea, vomiting, severe abdominal pain, dizziness/weakness/lightheadedness especially with standing, or any new, worsening, or concerning symptoms.

## 2019-12-31 NOTE — ED Triage Notes (Signed)
Pt reports having vaginal bleeding that started 2 hours ago and has went through over 3 pads in that time and was bleeding through pants. Pt reports she was seen on 12/02/19 and had a neg urine pregnancy test.

## 2019-12-31 NOTE — ED Provider Notes (Signed)
[facility]-EMERGENCY DEPT Provider Note   CSN: [id number] Arrival date & time: 12/31/19  1936     History Chief Complaint  Patient presents with  . Vaginal Bleeding    April Schaefer is a 34 y.o. female presenting for evaluation of vaginal bleeding.  Patient states of the past 4 hours, she has been having heavy vaginal bleeding.  6 hours prior, she passed 2 large, golf ball size, clots.  Her last period ended the fourth of this month, 2 weeks ago.  It was normal.  She took a pregnancy test this morning which was negative.  Her last pregnancy was in March 2020, that was terminated.  She denies fevers, chills, nausea, vomiting, abd pain, or pelvic pain.  She is sexually active with one female partner, who is symptom-free.  She has no other medical problems.  She denies dizziness, weakness, or lightheadedness. She does not have an OB/GYN.  She has a history of anemia during her pregnancies, however has never been checked for anemia when she is not pregnant.   HPI     Past Medical History:  Diagnosis Date  . Anxiety   . Hx of bursitis    R hip  . Medical history non-contributory   . UTI (urinary tract infection)   . Vaginal Pap smear, abnormal     Patient Active Problem List   Diagnosis Date Noted  . Pregnancy 08/02/2014    Past Surgical History:  Procedure Laterality Date  . COLPOSCOPY       OB History    Gravida  8   Para  4   Term  4   Preterm      AB  3   Living  4     SAB  2   TAB  1   Ectopic      Multiple      Live Births  4           Family History  Problem Relation Age of Onset  . Alcohol abuse Neg Hx   . Arthritis Neg Hx   . Asthma Neg Hx   . Birth defects Neg Hx   . Cancer Neg Hx   . COPD Neg Hx   . Depression Neg Hx   . Diabetes Neg Hx   . Drug abuse Neg Hx   . Early death Neg Hx   . Hearing loss Neg Hx   . Heart disease Neg Hx   . Hyperlipidemia Neg Hx   . Hypertension Neg Hx   . Kidney disease Neg Hx    . Learning disabilities Neg Hx   . Mental illness Neg Hx   . Mental retardation Neg Hx   . Miscarriages / Stillbirths Neg Hx   . Stroke Neg Hx   . Vision loss Neg Hx   . Varicose Veins Neg Hx     Social History   Tobacco Use  . Smoking status: Current Every Day Smoker    Packs/day: 0.25    Types: Cigarettes  . Smokeless tobacco: Never Used  Substance Use Topics  . Alcohol use: Yes  . Drug use: No    Home Medications Prior to Admission medications   Medication Sig Start Date End Date Taking? Authorizing Provider  amphetamine-dextroamphetamine (ADDERALL) 10 MG tablet Take 10 mg by mouth daily with breakfast.   Yes [provider]  lisdexamfetamine (VYVANSE) 10 MG capsule Take 10 mg by mouth daily.   Yes [provider]  famotidine (  PEPCID) 20 MG tablet Take 1 tablet (20 mg total) by mouth 2 (two) times daily. Patient not taking: Reported on 12/31/2019 05/02/17   Horton, Mayer Masker, MD  hydrOXYzine (ATARAX/VISTARIL) 25 MG tablet Take 1 tablet (25 mg total) by mouth every 6 (six) hours. Patient not taking: Reported on 12/31/2019 05/02/17   Horton, Mayer Masker, MD  lidocaine (XYLOCAINE) 2 % solution Use as directed 15 mLs in the mouth or throat as needed for mouth pain. Patient not taking: Reported on 12/31/2019 12/02/19   Dietrich Pates, PA-C  megestrol (MEGACE) 40 MG tablet Take 1 tablet (40 mg total) by mouth daily for 5 days. 12/31/19 01/05/20  Blong Busk, PA-C  predniSONE (DELTASONE) 20 MG tablet Take 2 tablets (40 mg total) by mouth daily. Patient not taking: Reported on 12/31/2019 05/02/17   Horton, Mayer Masker, MD    Allergies    Clindamycin/lincomycin, Metronidazole, and Nickel  Review of Systems   Review of Systems  Genitourinary: Positive for vaginal bleeding.  All other systems reviewed and are negative.   Physical Exam Updated Vital Signs BP 123/82   Pulse 67   Temp 98.2 F (36.8 C) (Oral)   Resp 19   Ht 5\' 7"  (1.702 m)   Wt 69.9 kg   LMP  12/11/2019   SpO2 100%   BMI 24.12 kg/m   Physical Exam Vitals and nursing note reviewed. Exam conducted with a chaperone present.  Constitutional:      General: She is not in acute distress.    Appearance: She is well-developed.     Comments: Resting comfortably in the bed no acute distress  HENT:     Head: Normocephalic and atraumatic.  Eyes:     Conjunctiva/sclera: Conjunctivae normal.     Pupils: Pupils are equal, round, and reactive to light.  Cardiovascular:     Rate and Rhythm: Normal rate and regular rhythm.  Pulmonary:     Effort: Pulmonary effort is normal. No respiratory distress.     Breath sounds: Normal breath sounds. No wheezing.  Abdominal:     General: There is no distension.     Palpations: Abdomen is soft. There is no mass.     Tenderness: There is no abdominal tenderness. There is no guarding or rebound.     Comments: No tenderness to palpation of the abdomen.  Soft without rigidity, guarding, distention.  Negative rebound.  Genitourinary:    Comments: Large clot blocking view of the cervix.  Moderate blood in vaginal canal.  No tenderness. Musculoskeletal:        General: Normal range of motion.     Cervical back: Normal range of motion and neck supple.  Skin:    General: Skin is warm and dry.  Neurological:     Mental Status: She is alert and oriented to person, place, and time.     ED Results / Procedures / Treatments   Labs (all labs ordered are listed, but only abnormal results are displayed) Labs Reviewed  COMPREHENSIVE METABOLIC PANEL - Abnormal; Notable for the following components:      Result Value   Alkaline Phosphatase 31 (*)    All other components within normal limits  CBC WITH DIFFERENTIAL/PLATELET  I-STAT BETA HCG BLOOD, ED ([facility], [facility], [facility] ONLY)    EKG None  Radiology No results found.  Procedures Procedures (including critical care time)  Medications Ordered in ED Medications  megestrol (MEGACE) tablet 40 mg (has no  administration in time range)    ED  Course  I have reviewed the triage vital signs and the nursing notes.  Pertinent labs & imaging results that were available during my care of the patient were reviewed by me and considered in my medical decision making (see chart for details).    MDM Rules/Calculators/A&P                      Patient presenting for evaluation of vaginal bleeding.  Physical exam shows patient appears nontoxic.  Vitals are stable.  No abdominal tenderness.  She had a negative pregnancy test today.  Will obtain labs including hCG and CBC.  Will perform pelvic exam.  On pelvic, patient with moderate amount of blood and large clot blocking the view of the cervix.  Attempted clot removal with moderate success.  Labs reassuring, pregnancy is negative.  As such, doubt ectopic.  Without pain, doubt torsion.  I do not believe she needs emergent ultrasound today.  Hemoglobin is stable.  As patient is having heavy bleeding, will consult with OB/GYN so patient can have close follow-up.  Discussed with OB/GYN who recommends outpatient follow-up.  Will give Megace to slow bleeding.  Discussed findings and plan with patient, who is agreeable.  Discussed return precautions including signs of infection or worsening anemia.  At this time, patient appears safe for discharge.  Return precautions given.  Patient states she understands and agrees to plan.  Final Clinical Impression(s) / ED Diagnoses Final diagnoses:  Vaginal bleeding    Rx / DC Orders ED Discharge Orders         Ordered    megestrol (MEGACE) 40 MG tablet  Daily     12/31/19 2324           Franchot Heidelberg, PA-C 12/31/19 2349    Milton Ferguson, MD 01/02/20 [phone]

## 2019-12-31 NOTE — ED Notes (Signed)
Pt provided with feminine pads, mesh panties and scrubs to change into in triage after bleeding through personal clothes upon arrival to ED.

## 2020-01-07 ENCOUNTER — Encounter: Payer: Self-pay | Admitting: Family Medicine

## 2020-01-07 ENCOUNTER — Encounter: Payer: Medicaid Other | Admitting: Family Medicine

## 2020-01-20 IMAGING — US US OB < 14 WEEKS - US OB TV
1 series · 15 of 28 positions shown · non-contrast
Comparison: None.

CLINICAL DATA: 32 y/o  F; vaginal bleeding and cramping.

EXAM:
OBSTETRIC <14 WK US AND TRANSVAGINAL OB US
TECHNIQUE: Both transabdominal and transvaginal ultrasound examinations were
performed for complete evaluation of the gestation as well as the
maternal uterus, adnexal regions, and pelvic cul-de-sac.
Transvaginal technique was performed to assess early pregnancy.

[Series 1: us ob < 14 weeks - us ob tv · 15 of 34 slices shown]
[im 1/34]
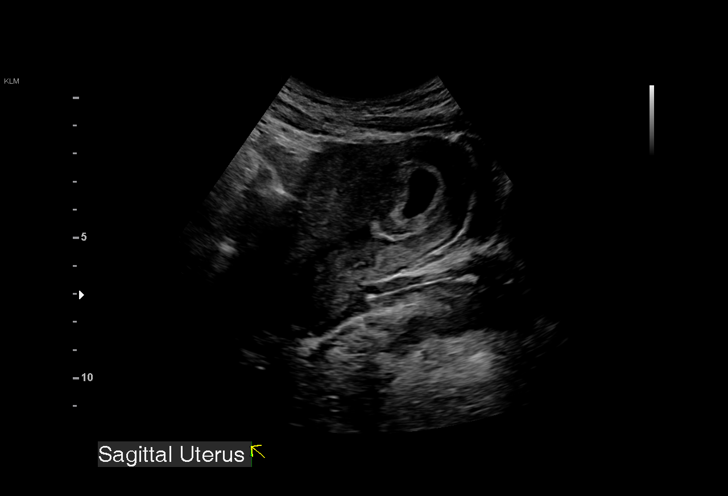
[im 3/34]
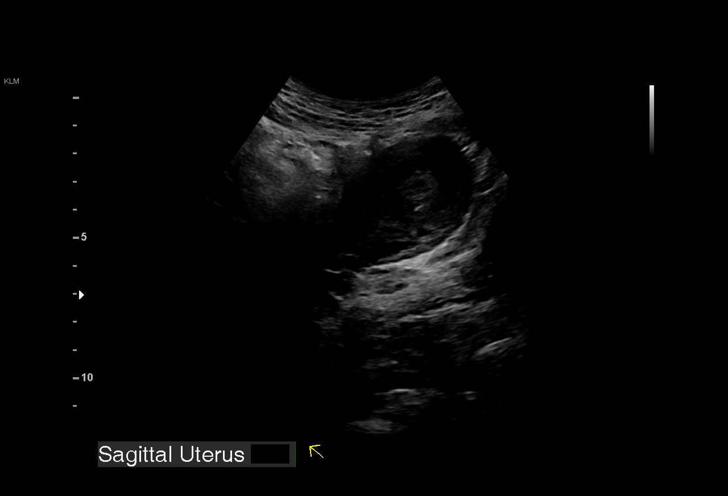
[im 5/34]
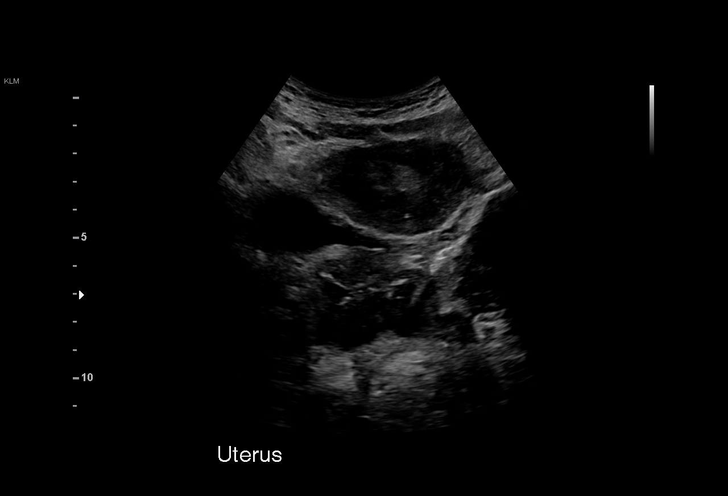
[im 8/34]
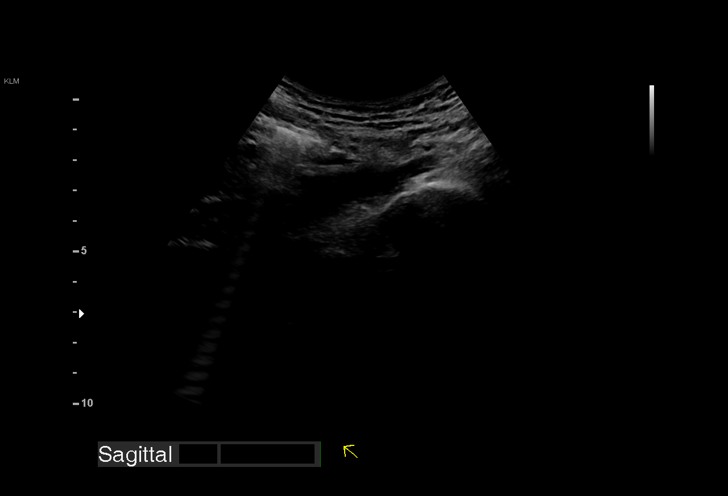
[im 10/34]
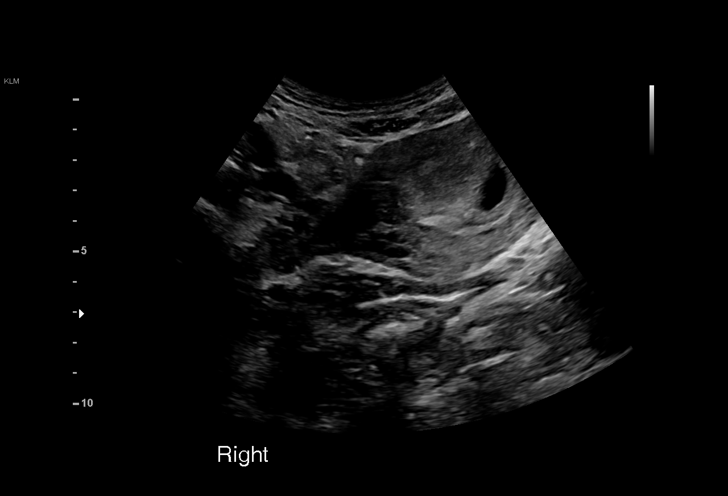
[im 13/34]
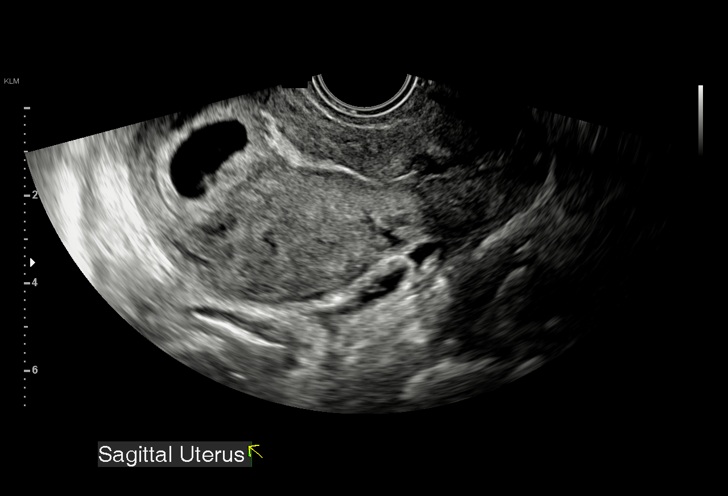
[im 15/34]
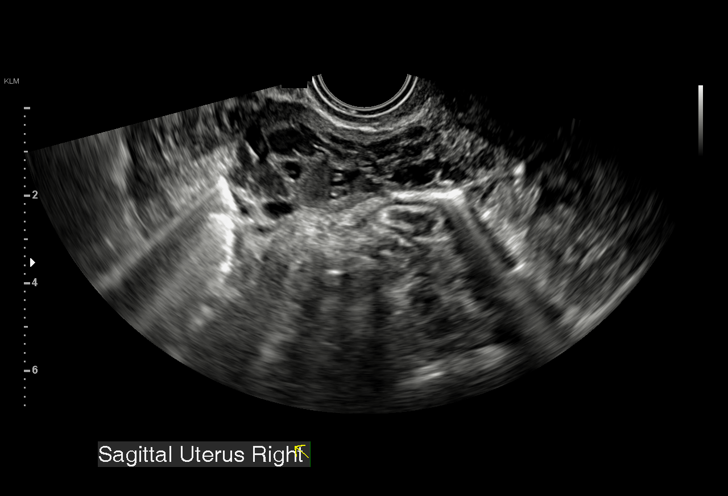
[im 18/34]
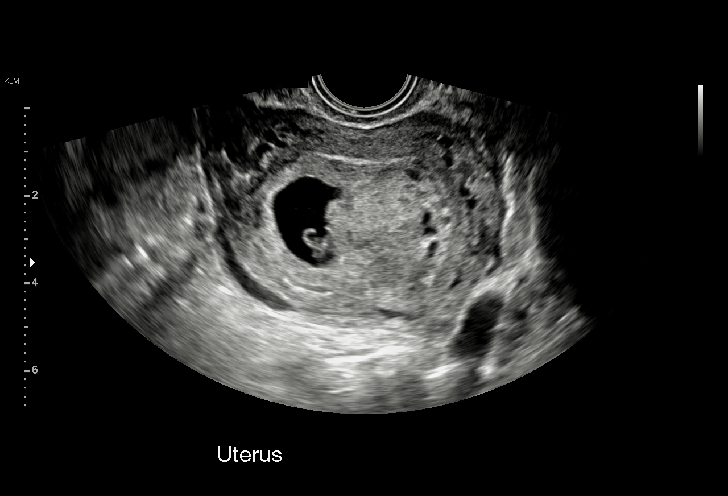
[im 19/34]
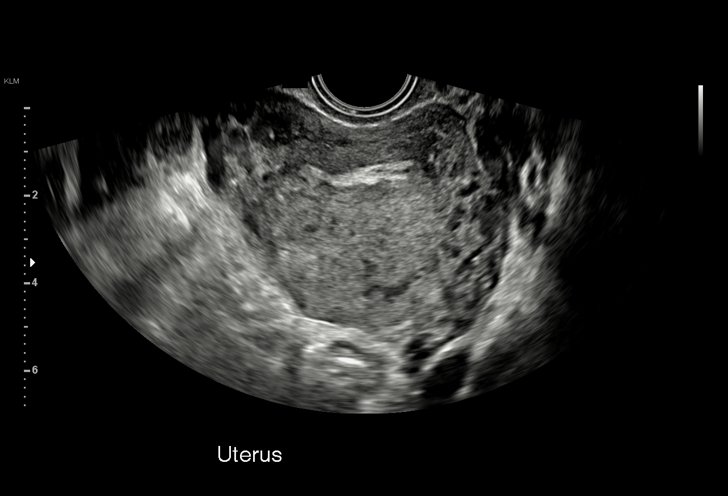
[im 21/34]
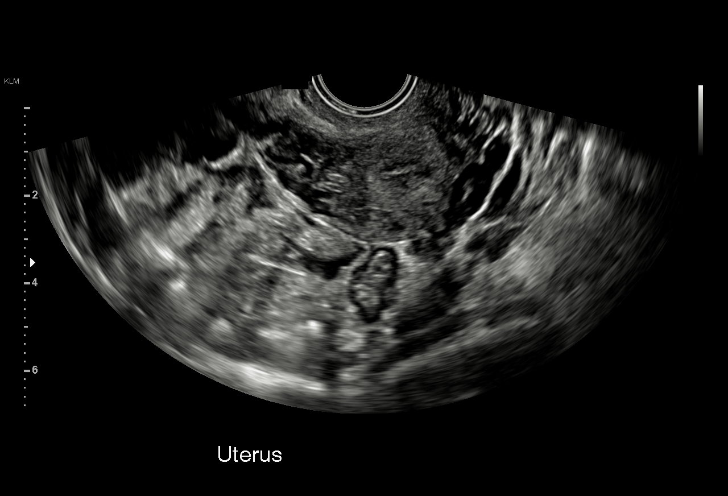
[im 24/34]
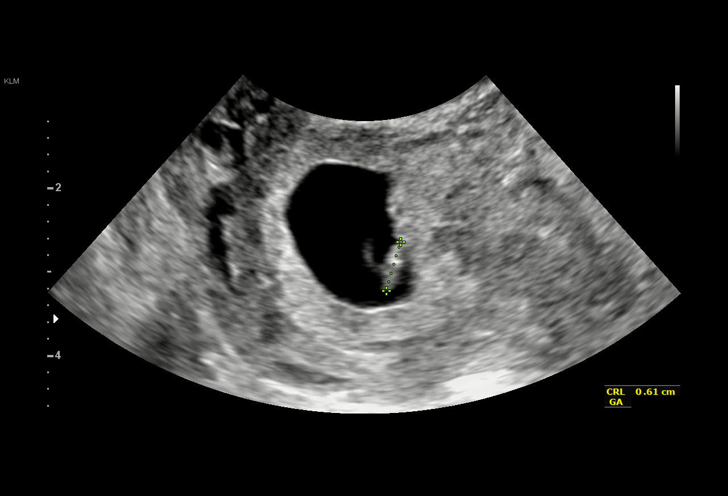
[im 26/34]
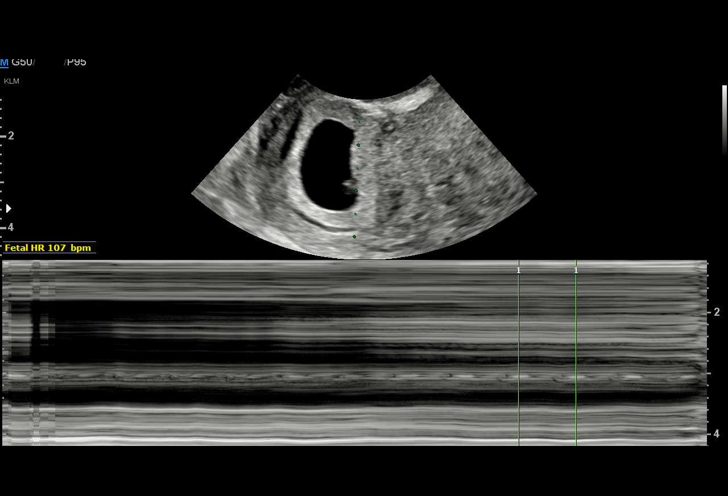
[im 29/34]
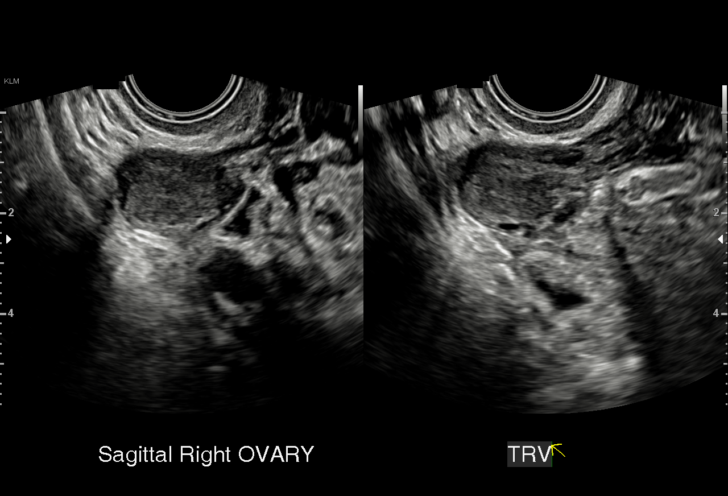
[im 31/34]
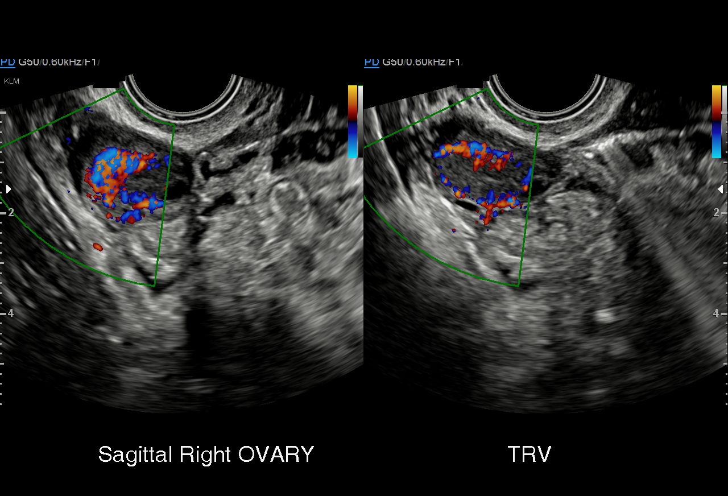
[im 34/34]
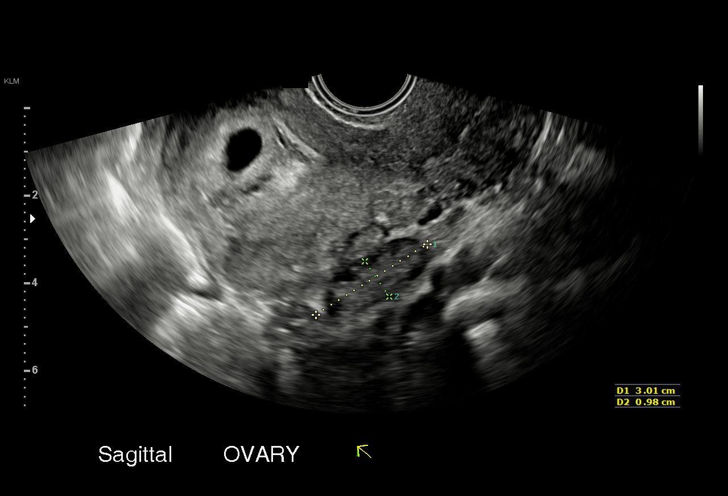

[15 of 28 positions shown; findings below may reference images not displayed]

FINDINGS: Intrauterine gestational sac: Single

Yolk sac:  Visualized.

Embryo:  Visualized.

Cardiac Activity: Visualized.

Heart Rate: 107 bpm

CRL: 5.6 mm mm   6 w   2 d                  US EDC: 08/19/2019

Subchorionic hemorrhage:  None visualized.

Maternal uterus/adnexae: Right ovary measures 2.6 x 1.8 x 2.4 cm.
Small right ovarian corpus luteum. Left ovary measures 3.0 x 1.0 x
1.3 cm.
IMPRESSION: Single live intrauterine pregnancy. Estimated gestational age 6
weeks 2 days.
